# Patient Record
Sex: Male | Born: 2000 | Race: Black or African American | Hispanic: No | Marital: Single | Smoking: Never smoker
Health system: Southern US, Community
[De-identification: ages and names within clinical notes are randomized; demographics above are authoritative.]

## PROBLEM LIST (undated history)

## (undated) DIAGNOSIS — F909 Attention-deficit hyperactivity disorder, unspecified type: Secondary | ICD-10-CM

## (undated) DIAGNOSIS — R569 Unspecified convulsions: Secondary | ICD-10-CM

## (undated) HISTORY — PX: FINGER SURGERY: SHX640

---
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
## 2020-08-23 ENCOUNTER — Inpatient Hospital Stay (HOSPITAL_COMMUNITY): Payer: Medicaid Other | Admitting: Anesthesiology

## 2020-08-23 ENCOUNTER — Inpatient Hospital Stay (HOSPITAL_COMMUNITY): Payer: Medicaid Other

## 2020-08-23 ENCOUNTER — Inpatient Hospital Stay (HOSPITAL_COMMUNITY)
Admission: EM | Admit: 2020-08-23 | Discharge: 2020-09-05 | DRG: 957 | Disposition: A | Discharge status: Rehabilitation Facility | Payer: Medicaid Other | Attending: Physician Assistant | Admitting: Physician Assistant

## 2020-08-23 ENCOUNTER — Emergency Department (HOSPITAL_COMMUNITY): Payer: Medicaid Other

## 2020-08-23 ENCOUNTER — Encounter (HOSPITAL_COMMUNITY): Payer: Self-pay | Admitting: Surgery

## 2020-08-23 ENCOUNTER — Encounter (HOSPITAL_COMMUNITY): Admission: EM | Disposition: A | Discharge status: Rehabilitation Facility | Payer: Self-pay | Source: Home / Self Care

## 2020-08-23 DIAGNOSIS — T1490XA Injury, unspecified, initial encounter: Secondary | ICD-10-CM

## 2020-08-23 DIAGNOSIS — I314 Cardiac tamponade: Secondary | ICD-10-CM

## 2020-08-23 DIAGNOSIS — S299XXA Unspecified injury of thorax, initial encounter: Secondary | ICD-10-CM

## 2020-08-23 DIAGNOSIS — Z9689 Presence of other specified functional implants: Secondary | ICD-10-CM

## 2020-08-23 DIAGNOSIS — Z01818 Encounter for other preprocedural examination: Secondary | ICD-10-CM

## 2020-08-23 DIAGNOSIS — R569 Unspecified convulsions: Secondary | ICD-10-CM

## 2020-08-23 DIAGNOSIS — S2600XA Unspecified injury of heart with hemopericardium, initial encounter: Secondary | ICD-10-CM | POA: Diagnosis present

## 2020-08-23 DIAGNOSIS — S0990XA Unspecified injury of head, initial encounter: Principal | ICD-10-CM | POA: Diagnosis present

## 2020-08-23 DIAGNOSIS — J9811 Atelectasis: Secondary | ICD-10-CM

## 2020-08-23 DIAGNOSIS — I313 Pericardial effusion (noninflammatory): Secondary | ICD-10-CM

## 2020-08-23 DIAGNOSIS — S26022D Major laceration of heart with hemopericardium, subsequent encounter: Secondary | ICD-10-CM

## 2020-08-23 DIAGNOSIS — J9690 Respiratory failure, unspecified, unspecified whether with hypoxia or hypercapnia: Secondary | ICD-10-CM

## 2020-08-23 DIAGNOSIS — E440 Moderate protein-calorie malnutrition: Secondary | ICD-10-CM | POA: Insufficient documentation

## 2020-08-23 DIAGNOSIS — Z0189 Encounter for other specified special examinations: Secondary | ICD-10-CM

## 2020-08-23 DIAGNOSIS — S02609A Fracture of mandible, unspecified, initial encounter for closed fracture: Secondary | ICD-10-CM | POA: Diagnosis present

## 2020-08-23 DIAGNOSIS — S26022A Major laceration of heart with hemopericardium, initial encounter: Secondary | ICD-10-CM | POA: Diagnosis present

## 2020-08-23 DIAGNOSIS — S0266XB Fracture of symphysis of mandible, initial encounter for open fracture: Secondary | ICD-10-CM | POA: Diagnosis not present

## 2020-08-23 DIAGNOSIS — J9601 Acute respiratory failure with hypoxia: Secondary | ICD-10-CM | POA: Diagnosis present

## 2020-08-23 DIAGNOSIS — E876 Hypokalemia: Secondary | ICD-10-CM | POA: Diagnosis not present

## 2020-08-23 DIAGNOSIS — Y848 Other medical procedures as the cause of abnormal reaction of the patient, or of later complication, without mention of misadventure at the time of the procedure: Secondary | ICD-10-CM | POA: Diagnosis not present

## 2020-08-23 DIAGNOSIS — R561 Post traumatic seizures: Secondary | ICD-10-CM | POA: Diagnosis not present

## 2020-08-23 DIAGNOSIS — R578 Other shock: Secondary | ICD-10-CM | POA: Diagnosis present

## 2020-08-23 DIAGNOSIS — K0889 Other specified disorders of teeth and supporting structures: Secondary | ICD-10-CM | POA: Diagnosis present

## 2020-08-23 DIAGNOSIS — S26020A Mild laceration of heart with hemopericardium, initial encounter: Secondary | ICD-10-CM | POA: Diagnosis present

## 2020-08-23 DIAGNOSIS — S065X9A Traumatic subdural hemorrhage with loss of consciousness of unspecified duration, initial encounter: Secondary | ICD-10-CM | POA: Diagnosis present

## 2020-08-23 DIAGNOSIS — R000 Tachycardia, unspecified: Secondary | ICD-10-CM

## 2020-08-23 DIAGNOSIS — E871 Hypo-osmolality and hyponatremia: Secondary | ICD-10-CM | POA: Diagnosis not present

## 2020-08-23 DIAGNOSIS — Z781 Physical restraint status: Secondary | ICD-10-CM

## 2020-08-23 DIAGNOSIS — S069X9D Unspecified intracranial injury with loss of consciousness of unspecified duration, subsequent encounter: Secondary | ICD-10-CM | POA: Diagnosis not present

## 2020-08-23 DIAGNOSIS — B9689 Other specified bacterial agents as the cause of diseases classified elsewhere: Secondary | ICD-10-CM | POA: Diagnosis not present

## 2020-08-23 DIAGNOSIS — R402432 Glasgow coma scale score 3-8, at arrival to emergency department: Secondary | ICD-10-CM | POA: Diagnosis present

## 2020-08-23 DIAGNOSIS — Z20822 Contact with and (suspected) exposure to covid-19: Secondary | ICD-10-CM | POA: Diagnosis present

## 2020-08-23 DIAGNOSIS — S061X9A Traumatic cerebral edema with loss of consciousness of unspecified duration, initial encounter: Secondary | ICD-10-CM | POA: Diagnosis not present

## 2020-08-23 DIAGNOSIS — Z681 Body mass index (BMI) 19 or less, adult: Secondary | ICD-10-CM

## 2020-08-23 DIAGNOSIS — R451 Restlessness and agitation: Secondary | ICD-10-CM | POA: Diagnosis present

## 2020-08-23 DIAGNOSIS — S025XXA Fracture of tooth (traumatic), initial encounter for closed fracture: Secondary | ICD-10-CM | POA: Diagnosis present

## 2020-08-23 DIAGNOSIS — H518 Other specified disorders of binocular movement: Secondary | ICD-10-CM | POA: Diagnosis present

## 2020-08-23 DIAGNOSIS — S0219XA Other fracture of base of skull, initial encounter for closed fracture: Secondary | ICD-10-CM | POA: Diagnosis present

## 2020-08-23 DIAGNOSIS — Y92410 Unspecified street and highway as the place of occurrence of the external cause: Secondary | ICD-10-CM

## 2020-08-23 DIAGNOSIS — I10 Essential (primary) hypertension: Secondary | ICD-10-CM | POA: Diagnosis present

## 2020-08-23 DIAGNOSIS — S8012XA Contusion of left lower leg, initial encounter: Secondary | ICD-10-CM | POA: Diagnosis present

## 2020-08-23 DIAGNOSIS — R4182 Altered mental status, unspecified: Secondary | ICD-10-CM | POA: Diagnosis present

## 2020-08-23 DIAGNOSIS — J95851 Ventilator associated pneumonia: Secondary | ICD-10-CM | POA: Diagnosis not present

## 2020-08-23 DIAGNOSIS — S032XXA Dislocation of tooth, initial encounter: Secondary | ICD-10-CM | POA: Diagnosis present

## 2020-08-23 DIAGNOSIS — I959 Hypotension, unspecified: Secondary | ICD-10-CM

## 2020-08-23 DIAGNOSIS — S2699XS Other injury of heart, unspecified with or without hemopericardium, sequela: Secondary | ICD-10-CM

## 2020-08-23 HISTORY — PX: STERNOTOMY: SHX1057

## 2020-08-23 HISTORY — DX: Unspecified injury of head, initial encounter: S09.90XA

## 2020-08-23 HISTORY — PX: REPAIR OF RIGHT ATRIAL LACERATION: SHX6552

## 2020-08-23 HISTORY — DX: Major laceration of heart with hemopericardium, initial encounter: S26.022A

## 2020-08-23 LAB — POCT I-STAT 7, (LYTES, BLD GAS, ICA,H+H)
Acid-base deficit: 15 mmol/L — ABNORMAL HIGH (ref 0.0–2.0)
Acid-base deficit: 9 mmol/L — ABNORMAL HIGH (ref 0.0–2.0)
Acid-base deficit: 3 mmol/L — ABNORMAL HIGH (ref 0.0–2.0)
Bicarbonate: 14 mmol/L — ABNORMAL LOW (ref 20.0–28.0)
Bicarbonate: 18.3 mmol/L — ABNORMAL LOW (ref 20.0–28.0)
Bicarbonate: 20.5 mmol/L (ref 20.0–28.0)
Calcium, Ion: 1.06 mmol/L — ABNORMAL LOW (ref 1.15–1.40)
Calcium, Ion: 1.01 mmol/L — ABNORMAL LOW (ref 1.15–1.40)
Calcium, Ion: 1.11 mmol/L — ABNORMAL LOW (ref 1.15–1.40)
HCT: 40 % (ref 39.0–52.0)
HCT: 40 % (ref 39.0–52.0)
HCT: 48 % (ref 39.0–52.0)
Hemoglobin: 13.6 g/dL (ref 13.0–17.0)
Hemoglobin: 13.6 g/dL (ref 13.0–17.0)
Hemoglobin: 16.3 g/dL (ref 13.0–17.0)
O2 Saturation: 100 %
O2 Saturation: 100 %
O2 Saturation: 100 %
Patient temperature: 35.5
Potassium: 3.5 mmol/L (ref 3.5–5.1)
Potassium: 4.1 mmol/L (ref 3.5–5.1)
Potassium: 4.9 mmol/L (ref 3.5–5.1)
Sodium: 143 mmol/L (ref 135–145)
Sodium: 144 mmol/L (ref 135–145)
Sodium: 140 mmol/L (ref 135–145)
TCO2: 15 mmol/L — ABNORMAL LOW (ref 22–32)
TCO2: 20 mmol/L — ABNORMAL LOW (ref 22–32)
TCO2: 21 mmol/L — ABNORMAL LOW (ref 22–32)
pCO2 arterial: 45 mmHg (ref 32.0–48.0)
pCO2 arterial: 42.7 mmHg (ref 32.0–48.0)
pCO2 arterial: 31.4 mmHg — ABNORMAL LOW (ref 32.0–48.0)
pH, Arterial: 7.102 — CL (ref 7.350–7.450)
pH, Arterial: 7.24 — ABNORMAL LOW (ref 7.350–7.450)
pH, Arterial: 7.416 (ref 7.350–7.450)
pO2, Arterial: 588 mmHg — ABNORMAL HIGH (ref 83.0–108.0)
pO2, Arterial: 304 mmHg — ABNORMAL HIGH (ref 83.0–108.0)
pO2, Arterial: 269 mmHg — ABNORMAL HIGH (ref 83.0–108.0)

## 2020-08-23 LAB — PREPARE FRESH FROZEN PLASMA
Unit division: 0
Unit division: 0

## 2020-08-23 LAB — COMPREHENSIVE METABOLIC PANEL
ALT: 85 U/L — ABNORMAL HIGH (ref 0–44)
AST: 119 U/L — ABNORMAL HIGH (ref 15–41)
Albumin: 3.5 g/dL (ref 3.5–5.0)
Alkaline Phosphatase: 49 U/L (ref 38–126)
Anion gap: 17 — ABNORMAL HIGH (ref 5–15)
BUN: 9 mg/dL (ref 6–20)
CO2: 16 mmol/L — ABNORMAL LOW (ref 22–32)
Calcium: 8.6 mg/dL — ABNORMAL LOW (ref 8.9–10.3)
Chloride: 108 mmol/L (ref 98–111)
Creatinine, Ser: 1.52 mg/dL — ABNORMAL HIGH (ref 0.61–1.24)
GFR, Estimated: >60 mL/min (ref >60)
Glucose, Bld: 115 mg/dL — ABNORMAL HIGH (ref 70–99)
Potassium: 3 mmol/L — ABNORMAL LOW (ref 3.5–5.1)
Sodium: 141 mmol/L (ref 135–145)
Total Bilirubin: 0.7 mg/dL (ref 0.3–1.2)
Total Protein: 5.6 g/dL — ABNORMAL LOW (ref 6.5–8.1)

## 2020-08-23 LAB — BASIC METABOLIC PANEL
Anion gap: 13 (ref 5–15)
BUN: 8 mg/dL (ref 6–20)
CO2: 20 mmol/L — ABNORMAL LOW (ref 22–32)
Calcium: 7.9 mg/dL — ABNORMAL LOW (ref 8.9–10.3)
Chloride: 108 mmol/L (ref 98–111)
Creatinine, Ser: 1.29 mg/dL — ABNORMAL HIGH (ref 0.61–1.24)
GFR, Estimated: >60 mL/min (ref >60)
Glucose, Bld: 110 mg/dL — ABNORMAL HIGH (ref 70–99)
Potassium: 5.1 mmol/L (ref 3.5–5.1)
Sodium: 141 mmol/L (ref 135–145)

## 2020-08-23 LAB — URINALYSIS, ROUTINE W REFLEX MICROSCOPIC
Bilirubin Urine: NEGATIVE
Glucose, UA: NEGATIVE mg/dL
Ketones, ur: NEGATIVE mg/dL
Leukocytes,Ua: NEGATIVE
Nitrite: NEGATIVE
Protein, ur: NEGATIVE mg/dL
Specific Gravity, Urine: 1.008 (ref 1.005–1.030)
pH: 7 (ref 5.0–8.0)

## 2020-08-23 LAB — POCT I-STAT, CHEM 8
BUN: 8 mg/dL (ref 6–20)
Calcium, Ion: 1.06 mmol/L — ABNORMAL LOW (ref 1.15–1.40)
Chloride: 109 mmol/L (ref 98–111)
Creatinine, Ser: 1.2 mg/dL (ref 0.61–1.24)
Glucose, Bld: 130 mg/dL — ABNORMAL HIGH (ref 70–99)
HCT: 41 % (ref 39.0–52.0)
Hemoglobin: 13.9 g/dL (ref 13.0–17.0)
Potassium: 3.5 mmol/L (ref 3.5–5.1)
Sodium: 143 mmol/L (ref 135–145)
TCO2: 16 mmol/L — ABNORMAL LOW (ref 22–32)

## 2020-08-23 LAB — I-STAT ARTERIAL BLOOD GAS, ED
Acid-base deficit: 17 mmol/L — ABNORMAL HIGH (ref 0.0–2.0)
Bicarbonate: 12.6 mmol/L — ABNORMAL LOW (ref 20.0–28.0)
Calcium, Ion: 1.1 mmol/L — ABNORMAL LOW (ref 1.15–1.40)
HCT: 42 % (ref 39.0–52.0)
Hemoglobin: 14.3 g/dL (ref 13.0–17.0)
O2 Saturation: 100 %
Patient temperature: 97.8
Potassium: 3.8 mmol/L (ref 3.5–5.1)
Sodium: 143 mmol/L (ref 135–145)
TCO2: 14 mmol/L — ABNORMAL LOW (ref 22–32)
pCO2 arterial: 40.8 mmHg (ref 32.0–48.0)
pH, Arterial: 7.096 — CL (ref 7.350–7.450)
pO2, Arterial: 374 mmHg — ABNORMAL HIGH (ref 83.0–108.0)

## 2020-08-23 LAB — CBC
HCT: 47 % (ref 39.0–52.0)
HCT: 50.3 % (ref 39.0–52.0)
Hemoglobin: 14.4 g/dL (ref 13.0–17.0)
Hemoglobin: 16.7 g/dL (ref 13.0–17.0)
MCH: 28.5 pg (ref 26.0–34.0)
MCH: 28.2 pg (ref 26.0–34.0)
MCHC: 30.6 g/dL (ref 30.0–36.0)
MCHC: 33.2 g/dL (ref 30.0–36.0)
MCV: 93.1 fL (ref 80.0–100.0)
MCV: 85 fL (ref 80.0–100.0)
Platelets: 206 10*3/uL (ref 150–400)
Platelets: 172 10*3/uL (ref 150–400)
RBC: 5.05 MIL/uL (ref 4.22–5.81)
RBC: 5.92 MIL/uL — ABNORMAL HIGH (ref 4.22–5.81)
RDW: 13.1 % (ref 11.5–15.5)
RDW: 13.2 % (ref 11.5–15.5)
WBC: 9.9 10*3/uL (ref 4.0–10.5)
WBC: 14.9 10*3/uL — ABNORMAL HIGH (ref 4.0–10.5)
nRBC: 0.2 % (ref 0.0–0.2)
nRBC: 0 % (ref 0.0–0.2)

## 2020-08-23 LAB — PREPARE RBC (CROSSMATCH)

## 2020-08-23 LAB — APTT: aPTT: 29 seconds (ref 24–36)

## 2020-08-23 LAB — PROTIME-INR
INR: 1.4 — ABNORMAL HIGH (ref 0.8–1.2)
INR: 1.3 — ABNORMAL HIGH (ref 0.8–1.2)
Prothrombin Time: 16.4 seconds — ABNORMAL HIGH (ref 11.4–15.2)
Prothrombin Time: 15.7 seconds — ABNORMAL HIGH (ref 11.4–15.2)

## 2020-08-23 LAB — BPAM FFP
Blood Product Expiration Date: 202204102359
Blood Product Expiration Date: 202204112359
ISSUE DATE / TIME: 202204090107
ISSUE DATE / TIME: 202204090107
Unit Type and Rh: 6200
Unit Type and Rh: 6200

## 2020-08-23 LAB — I-STAT CHEM 8, ED
BUN: 9 mg/dL (ref 6–20)
Calcium, Ion: 0.88 mmol/L — CL (ref 1.15–1.40)
Chloride: 109 mmol/L (ref 98–111)
Creatinine, Ser: 1.5 mg/dL — ABNORMAL HIGH (ref 0.61–1.24)
Glucose, Bld: 139 mg/dL — ABNORMAL HIGH (ref 70–99)
HCT: 44 % (ref 39.0–52.0)
Hemoglobin: 15 g/dL (ref 13.0–17.0)
Potassium: 3.9 mmol/L (ref 3.5–5.1)
Sodium: 139 mmol/L (ref 135–145)
TCO2: 11 mmol/L — ABNORMAL LOW (ref 22–32)

## 2020-08-23 LAB — MAGNESIUM: Magnesium: 1.6 mg/dL — ABNORMAL LOW (ref 1.7–2.4)

## 2020-08-23 LAB — RESP PANEL BY RT-PCR (FLU A&B, COVID) ARPGX2
Influenza A by PCR: NEGATIVE
Influenza B by PCR: NEGATIVE
SARS Coronavirus 2 by RT PCR: NEGATIVE

## 2020-08-23 LAB — GLUCOSE, CAPILLARY
Glucose-Capillary: 113 mg/dL — ABNORMAL HIGH (ref 70–99)
Glucose-Capillary: 107 mg/dL — ABNORMAL HIGH (ref 70–99)
Glucose-Capillary: 119 mg/dL — ABNORMAL HIGH (ref 70–99)
Glucose-Capillary: 111 mg/dL — ABNORMAL HIGH (ref 70–99)
Glucose-Capillary: 85 mg/dL (ref 70–99)

## 2020-08-23 LAB — ECHO INTRAOPERATIVE TEE
Height: 74 in
Weight: 2469.15 oz

## 2020-08-23 LAB — LACTIC ACID, PLASMA: Lactic Acid, Venous: 10.6 mmol/L (ref 0.5–1.9)

## 2020-08-23 LAB — BLOOD PRODUCT ORDER (VERBAL) VERIFICATION

## 2020-08-23 LAB — MRSA PCR SCREENING: MRSA by PCR: NEGATIVE

## 2020-08-23 LAB — ETHANOL: Alcohol, Ethyl (B): 32 mg/dL — ABNORMAL HIGH (ref <10)

## 2020-08-23 LAB — ABO/RH: ABO/RH(D): O POS

## 2020-08-23 LAB — HIV ANTIBODY (ROUTINE TESTING W REFLEX): HIV Screen 4th Generation wRfx: NONREACTIVE

## 2020-08-23 SURGERY — STERNOTOMY
Anesthesia: General | Site: Chest

## 2020-08-23 MED ORDER — ONDANSETRON HCL 4 MG/2ML IJ SOLN
4.0000 mg | Freq: Four times a day (QID) | INTRAMUSCULAR | Status: DC | PRN
  Administered 2020-09-03 – 2020-09-04 (×2): 4 mg via INTRAVENOUS
  Filled 2020-08-23 (×2): qty 2

## 2020-08-23 MED ORDER — LACTATED RINGERS IV SOLN: INTRAVENOUS | Status: DC

## 2020-08-23 MED ORDER — SODIUM CHLORIDE 0.9 % IV SOLN
INTRAVENOUS | Status: AC | PRN
  Administered 2020-08-23 (×2): 1000 mL via INTRAVENOUS

## 2020-08-23 MED ORDER — ACETAMINOPHEN 500 MG PO TABS
1000.0000 mg | ORAL_TABLET | Freq: Four times a day (QID) | ORAL | Status: AC
  Filled 2020-08-23: qty 2

## 2020-08-23 MED ORDER — CEFAZOLIN SODIUM-DEXTROSE 2-3 GM-%(50ML) IV SOLR
INTRAVENOUS | Status: DC | PRN
  Administered 2020-08-23: 2 g via INTRAVENOUS

## 2020-08-23 MED ORDER — ORAL CARE MOUTH RINSE: 15.0000 mL | OROMUCOSAL | Status: DC

## 2020-08-23 MED ORDER — IOHEXOL 300 MG/ML  SOLN
100.0000 mL | Freq: Once | INTRAMUSCULAR | Status: AC | PRN
  Administered 2020-08-23: 100 mL via INTRAVENOUS

## 2020-08-23 MED ORDER — DOCUSATE SODIUM 100 MG PO CAPS: 200.0000 mg | ORAL_CAPSULE | Freq: Every day | ORAL | Status: DC

## 2020-08-23 MED ORDER — PANTOPRAZOLE SODIUM 40 MG PO TBEC: 40.0000 mg | DELAYED_RELEASE_TABLET | Freq: Every day | ORAL | Status: DC

## 2020-08-23 MED ORDER — FENTANYL 2500MCG IN NS 250ML (10MCG/ML) PREMIX INFUSION
0.0000 ug/h | INTRAVENOUS | Status: DC
  Administered 2020-08-23 – 2020-08-24 (×2): 100 ug/h via INTRAVENOUS
  Administered 2020-08-25: 75 ug/h via INTRAVENOUS
  Administered 2020-08-26: 10 ug/h via INTRAVENOUS
  Administered 2020-08-27: 150 ug/h via INTRAVENOUS
  Administered 2020-08-28: 75 ug/h via INTRAVENOUS
  Filled 2020-08-23 (×7): qty 250

## 2020-08-23 MED ORDER — FAMOTIDINE IN NACL 20-0.9 MG/50ML-% IV SOLN: 20.0000 mg | Freq: Two times a day (BID) | INTRAVENOUS | Status: DC

## 2020-08-23 MED ORDER — SODIUM BICARBONATE 8.4 % IV SOLN
INTRAVENOUS | Status: DC | PRN
  Administered 2020-08-23: 50 meq via INTRAVENOUS

## 2020-08-23 MED ORDER — IOHEXOL 300 MG/ML  SOLN: 100.0000 mL | Freq: Once | INTRAMUSCULAR | Status: DC | PRN

## 2020-08-23 MED ORDER — PROPOFOL 1000 MG/100ML IV EMUL
INTRAVENOUS | Status: AC | PRN
  Administered 2020-08-23: 5 ug/kg/min via INTRAVENOUS

## 2020-08-23 MED ORDER — CHLORHEXIDINE GLUCONATE 0.12% ORAL RINSE (MEDLINE KIT): 15.0000 mL | Freq: Two times a day (BID) | OROMUCOSAL | Status: DC

## 2020-08-23 MED ORDER — FENTANYL CITRATE (PF) 100 MCG/2ML IJ SOLN
50.0000 ug | INTRAMUSCULAR | Status: DC | PRN
Start: 2020-08-23 — End: 2020-08-23
  Administered 2020-08-23 (×4): 100 ug via INTRAVENOUS
  Filled 2020-08-23 (×4): qty 2

## 2020-08-23 MED ORDER — CHLORHEXIDINE GLUCONATE 0.12% ORAL RINSE (MEDLINE KIT)
15.0000 mL | Freq: Two times a day (BID) | OROMUCOSAL | Status: DC
  Administered 2020-08-23 – 2020-09-05 (×24): 15 mL via OROMUCOSAL

## 2020-08-23 MED ORDER — ACETAMINOPHEN 160 MG/5ML PO SOLN: 650.0000 mg | Freq: Once | ORAL | Status: DC

## 2020-08-23 MED ORDER — SODIUM CHLORIDE 0.45 % IV SOLN: INTRAVENOUS | Status: DC | PRN

## 2020-08-23 MED ORDER — LORAZEPAM 2 MG/ML IJ SOLN
INTRAMUSCULAR | Status: AC | PRN
  Administered 2020-08-23: 2 mg via INTRAVENOUS

## 2020-08-23 MED ORDER — ALBUMIN HUMAN 5 % IV SOLN: 250.0000 mL | INTRAVENOUS | Status: DC | PRN

## 2020-08-23 MED ORDER — DEXTROSE 50 % IV SOLN: 0.0000 mL | INTRAVENOUS | Status: DC | PRN

## 2020-08-23 MED ORDER — SODIUM CHLORIDE 0.9 % IV SOLN
INTRAVENOUS | Status: AC
  Filled 2020-08-23: qty 1.2

## 2020-08-23 MED ORDER — ACETAMINOPHEN 650 MG RE SUPP: 650.0000 mg | Freq: Once | RECTAL | Status: DC

## 2020-08-23 MED ORDER — ACETAMINOPHEN 160 MG/5ML PO SOLN
1000.0000 mg | Freq: Four times a day (QID) | ORAL | Status: AC
  Administered 2020-08-23 – 2020-08-27 (×19): 1000 mg
  Filled 2020-08-23 (×19): qty 40.6

## 2020-08-23 MED ORDER — OXYCODONE HCL 5 MG PO TABS: 5.0000 mg | ORAL_TABLET | ORAL | Status: DC | PRN

## 2020-08-23 MED ORDER — ONDANSETRON 4 MG PO TBDP
4.0000 mg | ORAL_TABLET | Freq: Four times a day (QID) | ORAL | Status: DC | PRN
  Filled 2020-08-23: qty 1

## 2020-08-23 MED ORDER — PROPOFOL 1000 MG/100ML IV EMUL
5.0000 ug/kg/min | INTRAVENOUS | Status: DC
  Administered 2020-08-23: 15 ug/kg/min via INTRAVENOUS
  Administered 2020-08-23: 65 ug/kg/min via INTRAVENOUS
  Administered 2020-08-23: 35 ug/kg/min via INTRAVENOUS
  Administered 2020-08-23: 50 ug/kg/min via INTRAVENOUS
  Administered 2020-08-24: 30 ug/kg/min via INTRAVENOUS
  Administered 2020-08-24 – 2020-08-25 (×3): 40 ug/kg/min via INTRAVENOUS
  Administered 2020-08-25: 60 ug/kg/min via INTRAVENOUS
  Administered 2020-08-25 (×2): 40 ug/kg/min via INTRAVENOUS
  Administered 2020-08-25: 50 ug/kg/min via INTRAVENOUS
  Administered 2020-08-26: 60 ug/kg/min via INTRAVENOUS
  Administered 2020-08-26: 50 ug/kg/min via INTRAVENOUS
  Administered 2020-08-26: 60 ug/kg/min via INTRAVENOUS
  Filled 2020-08-23: qty 200
  Filled 2020-08-23 (×15): qty 100

## 2020-08-23 MED ORDER — SODIUM CHLORIDE 0.9 % IV SOLN: INTRAVENOUS | Status: DC | PRN

## 2020-08-23 MED ORDER — SUCCINYLCHOLINE CHLORIDE 20 MG/ML IJ SOLN
INTRAMUSCULAR | Status: AC | PRN
  Administered 2020-08-23: 100 mg via INTRAVENOUS

## 2020-08-23 MED ORDER — INSULIN ASPART 100 UNIT/ML ~~LOC~~ SOLN
0.0000 [IU] | SUBCUTANEOUS | Status: DC
  Administered 2020-08-24 – 2020-08-28 (×10): 1 [IU] via SUBCUTANEOUS
  Administered 2020-08-28: 2 [IU] via SUBCUTANEOUS
  Administered 2020-08-28 – 2020-09-02 (×20): 1 [IU] via SUBCUTANEOUS
  Administered 2020-09-03: 2 [IU] via SUBCUTANEOUS
  Administered 2020-09-03 (×2): 1 [IU] via SUBCUTANEOUS
  Administered 2020-09-03: 2 [IU] via SUBCUTANEOUS
  Administered 2020-09-03 – 2020-09-05 (×5): 1 [IU] via SUBCUTANEOUS

## 2020-08-23 MED ORDER — ALBUMIN HUMAN 5 % IV SOLN: INTRAVENOUS | Status: DC | PRN

## 2020-08-23 MED ORDER — ETOMIDATE 2 MG/ML IV SOLN
INTRAVENOUS | Status: AC | PRN
  Administered 2020-08-23: 20 mg via INTRAVENOUS

## 2020-08-23 MED ORDER — MAGNESIUM SULFATE 4 GM/100ML IV SOLN
4.0000 g | Freq: Once | INTRAVENOUS | Status: DC
  Filled 2020-08-23: qty 100

## 2020-08-23 MED ORDER — 0.9 % SODIUM CHLORIDE (POUR BTL) OPTIME
TOPICAL | Status: DC | PRN
  Administered 2020-08-23: 5000 mL

## 2020-08-23 MED ORDER — POTASSIUM CHLORIDE 10 MEQ/50ML IV SOLN: 10.0000 meq | INTRAVENOUS | Status: AC

## 2020-08-23 MED ORDER — LACTATED RINGERS IV SOLN: 500.0000 mL | Freq: Once | INTRAVENOUS | Status: DC | PRN

## 2020-08-23 MED ORDER — DEXMEDETOMIDINE HCL IN NACL 400 MCG/100ML IV SOLN: 0.0000 ug/kg/h | INTRAVENOUS | Status: DC

## 2020-08-23 MED ORDER — BISACODYL 5 MG PO TBEC: 10.0000 mg | DELAYED_RELEASE_TABLET | Freq: Every day | ORAL | Status: DC

## 2020-08-23 MED ORDER — TRAMADOL HCL 50 MG PO TABS: 50.0000 mg | ORAL_TABLET | ORAL | Status: DC | PRN

## 2020-08-23 MED ORDER — FENTANYL CITRATE (PF) 250 MCG/5ML IJ SOLN
INTRAMUSCULAR | Status: AC
  Filled 2020-08-23: qty 5

## 2020-08-23 MED ORDER — ONDANSETRON HCL 4 MG/2ML IJ SOLN: 4.0000 mg | Freq: Four times a day (QID) | INTRAMUSCULAR | Status: DC | PRN

## 2020-08-23 MED ORDER — IOHEXOL 350 MG/ML SOLN
75.0000 mL | Freq: Once | INTRAVENOUS | Status: AC | PRN
  Administered 2020-08-23: 75 mL via INTRAVENOUS

## 2020-08-23 MED ORDER — BISACODYL 10 MG RE SUPP: 10.0000 mg | Freq: Every day | RECTAL | Status: DC

## 2020-08-23 MED ORDER — MORPHINE SULFATE (PF) 2 MG/ML IV SOLN: 1.0000 mg | INTRAVENOUS | Status: DC | PRN

## 2020-08-23 MED ORDER — FENTANYL CITRATE (PF) 250 MCG/5ML IJ SOLN
INTRAMUSCULAR | Status: DC | PRN
  Administered 2020-08-23 (×2): 100 ug via INTRAVENOUS
  Administered 2020-08-23: 50 ug via INTRAVENOUS

## 2020-08-23 MED ORDER — PROPOFOL 500 MG/50ML IV EMUL
INTRAVENOUS | Status: DC | PRN
  Administered 2020-08-23: 50 ug/kg/min via INTRAVENOUS

## 2020-08-23 MED ORDER — CHLORHEXIDINE GLUCONATE CLOTH 2 % EX PADS
6.0000 | MEDICATED_PAD | Freq: Every day | CUTANEOUS | Status: DC
  Administered 2020-08-23 – 2020-09-05 (×12): 6 via TOPICAL

## 2020-08-23 MED ORDER — FENTANYL CITRATE (PF) 100 MCG/2ML IJ SOLN
INTRAMUSCULAR | Status: AC | PRN
  Administered 2020-08-23: 100 ug via INTRAVENOUS

## 2020-08-23 MED ORDER — SODIUM CHLORIDE 0.9 % IV SOLN: 250.0000 mL | INTRAVENOUS | Status: DC

## 2020-08-23 MED ORDER — ORAL CARE MOUTH RINSE
15.0000 mL | OROMUCOSAL | Status: DC
  Administered 2020-08-24 – 2020-08-29 (×46): 15 mL via OROMUCOSAL

## 2020-08-23 MED ORDER — MUPIROCIN 2 % EX OINT
1.0000 "application " | TOPICAL_OINTMENT | Freq: Two times a day (BID) | CUTANEOUS | Status: DC
  Administered 2020-08-23: 1 via NASAL
  Filled 2020-08-23: qty 22

## 2020-08-23 MED ORDER — ROCURONIUM 10MG/ML (10ML) SYRINGE FOR MEDFUSION PUMP - OPTIME
INTRAVENOUS | Status: DC | PRN
  Administered 2020-08-23: 70 mg via INTRAVENOUS

## 2020-08-23 MED ORDER — MIDAZOLAM HCL 2 MG/2ML IJ SOLN: 2.0000 mg | INTRAMUSCULAR | Status: DC | PRN

## 2020-08-23 MED ORDER — DEXTROSE-NACL 5-0.45 % IV SOLN
INTRAVENOUS | Status: DC
  Administered 2020-08-24: 100 mL/h via INTRAVENOUS

## 2020-08-23 MED ORDER — PROPOFOL 1000 MG/100ML IV EMUL
INTRAVENOUS | Status: AC
  Filled 2020-08-23: qty 100

## 2020-08-23 MED ORDER — PLASMA-LYTE 148 IV SOLN
INTRAVENOUS | Status: DC | PRN
  Administered 2020-08-23: 500 mL via INTRAVASCULAR

## 2020-08-23 MED ORDER — SODIUM CHLORIDE 0.9 % IV SOLN: INTRAVENOUS | Status: DC

## 2020-08-23 MED ORDER — LACTATED RINGERS IV SOLN: INTRAVENOUS | Status: DC | PRN

## 2020-08-23 MED ORDER — LORAZEPAM 2 MG/ML IJ SOLN
1.0000 mg | Freq: Once | INTRAMUSCULAR | Status: AC
  Administered 2020-08-23: 1 mg via INTRAVENOUS

## 2020-08-23 MED ORDER — SODIUM CHLORIDE 0.9% FLUSH: 3.0000 mL | INTRAVENOUS | Status: DC | PRN

## 2020-08-23 MED ORDER — CHLORHEXIDINE GLUCONATE 0.12 % MT SOLN: 15.0000 mL | OROMUCOSAL | Status: AC

## 2020-08-23 MED ORDER — METOPROLOL TARTRATE 5 MG/5ML IV SOLN
2.5000 mg | INTRAVENOUS | Status: DC | PRN
Start: 2020-08-23 — End: 2020-09-05
  Administered 2020-08-23 (×2): 2 mg via INTRAVENOUS
  Administered 2020-08-23: 5 mg via INTRAVENOUS
  Administered 2020-08-24 (×2): 2.5 mg via INTRAVENOUS
  Administered 2020-08-25 – 2020-08-26 (×2): 5 mg via INTRAVENOUS
  Administered 2020-08-26: 2.5 mg via INTRAVENOUS
  Filled 2020-08-23 (×6): qty 5

## 2020-08-23 MED ORDER — CEFAZOLIN SODIUM-DEXTROSE 1-4 GM/50ML-% IV SOLN
1.0000 g | Freq: Three times a day (TID) | INTRAVENOUS | Status: AC
  Administered 2020-08-23 (×2): 1 g via INTRAVENOUS
  Filled 2020-08-23 (×2): qty 50

## 2020-08-23 MED ORDER — LORAZEPAM 2 MG/ML IJ SOLN
1.0000 mg | Freq: Once | INTRAMUSCULAR | Status: AC
  Administered 2020-08-23: 1 mg via INTRAVENOUS
  Filled 2020-08-23: qty 1

## 2020-08-23 MED ORDER — LEVETIRACETAM IN NACL 1000 MG/100ML IV SOLN
1000.0000 mg | Freq: Once | INTRAVENOUS | Status: AC
  Administered 2020-08-23: 1000 mg via INTRAVENOUS
  Filled 2020-08-23: qty 100

## 2020-08-23 MED ORDER — LEVETIRACETAM IN NACL 500 MG/100ML IV SOLN
500.0000 mg | Freq: Two times a day (BID) | INTRAVENOUS | Status: DC
  Administered 2020-08-23 – 2020-08-24 (×2): 500 mg via INTRAVENOUS
  Filled 2020-08-23 (×2): qty 100

## 2020-08-23 MED ORDER — SODIUM CHLORIDE (PF) 0.9 % IJ SOLN
OROMUCOSAL | Status: DC | PRN
  Administered 2020-08-23 (×3): 4 mL via TOPICAL

## 2020-08-23 MED ORDER — MAGNESIUM SULFATE 4 GM/100ML IV SOLN
4.0000 g | Freq: Once | INTRAVENOUS | Status: AC
  Administered 2020-08-23: 4 g via INTRAVENOUS

## 2020-08-23 MED ORDER — LORAZEPAM 2 MG/ML IJ SOLN
INTRAMUSCULAR | Status: AC
  Filled 2020-08-23: qty 1

## 2020-08-23 MED ORDER — SODIUM CHLORIDE 0.9% FLUSH
3.0000 mL | Freq: Two times a day (BID) | INTRAVENOUS | Status: DC
  Administered 2020-08-24 – 2020-08-27 (×6): 3 mL via INTRAVENOUS

## 2020-08-23 MED ORDER — INSULIN REGULAR(HUMAN) IN NACL 100-0.9 UT/100ML-% IV SOLN: INTRAVENOUS | Status: DC

## 2020-08-23 SURGICAL SUPPLY — 50 items
BLADE CLIPPER SURG (BLADE) IMPLANT
CANISTER SUCT 3000ML PPV (MISCELLANEOUS) ×3 IMPLANT
CATH THORACIC 28FR (CATHETERS) IMPLANT
CATH THORACIC 28FR RT ANG (CATHETERS) IMPLANT
CATH THORACIC 36FR (CATHETERS) IMPLANT
CATH THORACIC 36FR RT ANG (CATHETERS) IMPLANT
CNTNR URN SCR LID CUP LEK RST (MISCELLANEOUS) ×2 IMPLANT
CONN ST 1/4X3/8  BEN (MISCELLANEOUS) ×2
CONN ST 1/4X3/8 BEN (MISCELLANEOUS) ×4 IMPLANT
CONT SPEC 4OZ STRL OR WHT (MISCELLANEOUS) ×3
COVER SURGICAL LIGHT HANDLE (MISCELLANEOUS) ×6 IMPLANT
DRAIN CHANNEL 32F RND 10.7 FF (WOUND CARE) ×6 IMPLANT
DRAPE CARDIOVASCULAR INCISE (DRAPES) ×3
DRAPE SRG 135X102X78XABS (DRAPES) ×2 IMPLANT
DRSG AQUACEL AG ADV 3.5X14 (GAUZE/BANDAGES/DRESSINGS) ×3 IMPLANT
ELECT BLADE 4.0 EZ CLEAN MEGAD (MISCELLANEOUS) ×3
ELECT REM PT RETURN 9FT ADLT (ELECTROSURGICAL) ×3
ELECTRODE BLDE 4.0 EZ CLN MEGD (MISCELLANEOUS) ×2 IMPLANT
ELECTRODE REM PT RTRN 9FT ADLT (ELECTROSURGICAL) ×2 IMPLANT
FIBERTAPE STERNAL CLSR 2 36IN (SUTURE) ×12 IMPLANT
FIBERTAPE STERNAL CLSR 2X36 (SUTURE) ×12 IMPLANT
GAUZE SPONGE 4X4 12PLY STRL (GAUZE/BANDAGES/DRESSINGS) ×3 IMPLANT
GLOVE ORTHO TXT STRL SZ7.5 (GLOVE) ×6 IMPLANT
GOWN STRL REUS W/ TWL XL LVL3 (GOWN DISPOSABLE) ×8 IMPLANT
GOWN STRL REUS W/TWL XL LVL3 (GOWN DISPOSABLE) ×12
HEMOSTAT POWDER SURGIFOAM 1G (HEMOSTASIS) ×9 IMPLANT
KIT BASIN OR (CUSTOM PROCEDURE TRAY) ×3 IMPLANT
KIT TURNOVER KIT B (KITS) ×3 IMPLANT
NDL SUT PASSING CERCLAGE MED (SUTURE) ×6
NEEDLE SUT PASSING CERCLAG MED (SUTURE) ×4 IMPLANT
NS IRRIG 1000ML POUR BTL (IV SOLUTION) ×15 IMPLANT
PACK E OPEN HEART (SUTURE) ×3 IMPLANT
PACK OPEN HEART (CUSTOM PROCEDURE TRAY) ×3 IMPLANT
PAD ARMBOARD 7.5X6 YLW CONV (MISCELLANEOUS) ×6 IMPLANT
PAD ELECT DEFIB RADIOL ZOLL (MISCELLANEOUS) ×3 IMPLANT
STRIP CLOSURE SKIN 1/2X4 (GAUZE/BANDAGES/DRESSINGS) ×3 IMPLANT
SUT MNCRL AB 3-0 PS2 18 (SUTURE) ×3 IMPLANT
SUT PROLENE 3 0 SH DA (SUTURE) ×3 IMPLANT
SUT PROLENE 3 0 SH1 36 (SUTURE) ×3 IMPLANT
SUT VIC AB 1 CTX 27 (SUTURE) ×6 IMPLANT
SUT VIC AB 2-0 CT1 36 (SUTURE) ×3 IMPLANT
SUT VIC AB 3-0 SH 18 (SUTURE) ×6 IMPLANT
SUT VIC AB 3-0 SH 8-18 (SUTURE) ×6 IMPLANT
SWAB COLLECTION DEVICE MRSA (MISCELLANEOUS) IMPLANT
SWAB CULTURE ESWAB REG 1ML (MISCELLANEOUS) IMPLANT
SYR 50ML SLIP (SYRINGE) IMPLANT
SYSTEM SAHARA CHEST DRAIN ATS (WOUND CARE) ×3 IMPLANT
TOWEL GREEN STERILE (TOWEL DISPOSABLE) ×3 IMPLANT
TOWEL GREEN STERILE FF (TOWEL DISPOSABLE) ×3 IMPLANT
WATER STERILE IRR 1000ML POUR (IV SOLUTION) ×3 IMPLANT

## 2020-08-23 NOTE — Progress Notes (Signed)
RN called [facility] just to have a second Nurse look at pupil reactions. RN was told in report that pt has been having episodes of noncreative changes to light. On my shift, Right Pupil seems to be 31mm and react brisk as the left pupil is 80mm and sluggish.

## 2020-08-23 NOTE — Transfer of Care (Signed)
Immediate Anesthesia Transfer of Care Note  Patient: Steve Mejia  Procedure(s) Performed: MEDIAL STERNOTOMY (N/A Chest) REPAIR OF RIGHT ATRIAL LACERATION (N/A Chest)  Patient Location: SICU  Anesthesia Type:General  Level of Consciousness: Patient remains intubated per anesthesia plan  Airway & Oxygen Therapy: Patient placed on Ventilator (see vital sign flow sheet for setting)  Post-op Assessment: Report given to RN and Post -op Vital signs reviewed and stable  Post vital signs: Reviewed and stable  Last Vitals:  Vitals Value Taken Time  BP 151/109 08/23/20 0516  Temp 35.4 C 08/23/20 0523  Pulse 62 08/23/20 0523  Resp 21 08/23/20 0523  SpO2 100 % 08/23/20 0523  Vitals shown include unvalidated device data.  Last Pain:  Vitals:   08/23/20 0120  TempSrc: Temporal         Complications: No complications documented.
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
## 2020-08-23 NOTE — Progress Notes (Signed)
EEG complete - results pending 
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
## 2020-08-23 NOTE — TOC CAGE-AID Note (Signed)
Transition of Care [facility]) - CAGE-AID Screening   Patient Details  Name: Steve Mejia MRN: [id number] Date of Birth: 2001-03-30  Hewitt Shorts, RN Trauma Response Nurse Phone Number: 08/23/2020, 5:14 PM   Clinical Narrative: pt is currently intubated in ICU- unable to participate    CAGE-AID Screening: Substance Abuse Screening unable to be completed due to: : Patient unable to participate (Pt is intubated and sedated)  Have You Ever Felt You Ought to Cut Down on Your Drinking or Drug Use?: No Have People Annoyed You By Critizing Your Drinking Or Drug Use?: No Have You Felt Bad Or Guilty About Your Drinking Or Drug Use?: No Have You Ever Had a Drink or Used Drugs First Thing In The Morning to Steady Your Nerves or to Get Rid of a Hangover?: No CAGE-AID Score: 0
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
## 2020-08-23 NOTE — Progress Notes (Signed)
  Echocardiogram Echocardiogram Transesophageal has been performed.  Delcie Roch 08/23/2020, 8:19 AM
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
## 2020-08-23 NOTE — ED Notes (Signed)
Patient transported to CT 
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
## 2020-08-23 NOTE — Progress Notes (Signed)
[address]       [name] [address]             [phone]        CARDIOTHORACIC SURGERY PROGRESS NOTE   RDay of Surgery Procedure(s) (LRB): MEDIAL STERNOTOMY (N/A) REPAIR OF RIGHT ATRIAL LACERATION (N/A)  Subjective: Sedated on vent.  Reportedly has not woken up yet.  Objective: Vital signs: BP Readings from Last 1 Encounters:  08/23/20 (!) 128/93   Pulse Readings from Last 1 Encounters:  08/23/20 96   Resp Readings from Last 1 Encounters:  08/23/20 18   Temp Readings from Last 1 Encounters:  08/23/20 (!) 100.4 F (38 C)    Hemodynamics:    Physical Exam:  Rhythm:   sinus  Breath sounds: clear  Heart sounds:  RRR  Incisions:  Dressing dry, intact  Abdomen:  Soft, non-distended  Extremities:  Warm, well-perfused  Chest tubes:  Very low volume thin serosanguinous output, no air leak    Intake/Output from previous day: 04/08 0701 - 04/09 0700 In: 6328 [I.V.:4153; Blood:1325; IV Piggyback:250] Out: 0177 [Urine:2475; Emesis/NG output:250; Blood:1200; Chest Tube:40] Intake/Output this shift: Total I/O In: 131 [I.V.:131] Out: 475 [Urine:475]  Lab Results:  CBC: Recent Labs    08/23/20 0153 08/23/20 0200 08/23/20 0500 08/23/20 0529  WBC 9.9  --  14.9*  --   HGB 14.4   < > 16.7 16.3  HCT 47.0   < > 50.3 48.0  PLT 206  --  172  --    < > = values in this interval not displayed.    BMET:  Recent Labs    08/23/20 0211 08/23/20 0241 08/23/20 0327 08/23/20 0426 08/23/20 0500 08/23/20 0529  NA 141   < > 143   < > 141 140  K 3.0*   < > 3.5   < > 5.1 4.9  CL 108  --  109  --  108  --   CO2 16*  --   --   --  20*  --   GLUCOSE 115*  --  130*  --  110*  --   BUN 9  --  8  --  8  --   CREATININE 1.52*  --  1.20  --  1.29*  --   CALCIUM 8.6*  --   --   --  7.9*  --    < > = values in this interval not displayed.     PT/INR:   Recent Labs    08/23/20 0500  LABPROT 15.7*  INR 1.3*    CBG (last 3)  Recent Labs     08/23/20 0526 08/23/20 0748  GLUCAP 113* 107*    ABG    Component Value Date/Time   PHART 7.416 08/23/2020 0529   PCO2ART 31.4 (L) 08/23/2020 0529   PO2ART 269 (H) 08/23/2020 0529   HCO3 20.5 08/23/2020 0529   TCO2 21 (L) 08/23/2020 0529   ACIDBASEDEF 3.0 (H) 08/23/2020 0529   O2SAT 100.0 08/23/2020 0529    CXR: PORTABLE CHEST 1 VIEW  COMPARISON:  Chest x-ray 08/23/2020.  FINDINGS: Patient is intubated, with the tip of the endotracheal tube 5.5 cm above the carina. New tubing projecting over the mediastinum likely reflects mediastinal/pericardial drains. Lung volumes are normal. No consolidative airspace disease. No pleural effusions. No pneumothorax. No pulmonary nodule or mass noted. Pulmonary vasculature and the cardiomediastinal silhouette are within normal limits.  IMPRESSION: 1. Support apparatus, as above. 2.  No radiographic evidence of acute cardiopulmonary disease.   Electronically Signed   By: Trudie Reed M.D.   On: 08/23/2020 06:33   Assessment/Plan: S/P Procedure(s) (LRB): MEDIAL STERNOTOMY (N/A) REPAIR OF RIGHT ATRIAL LACERATION (N/A)  Clinically stable from standpoint of cardiac injury Neuro status unclear - patient does not have any metal implants and could undergo MRI brain if indicated Will defer remainder of care to trauma service   Purcell Nails, MD 08/23/2020 8:54 AM

## 2020-08-23 NOTE — Progress Notes (Addendum)
EEG due to diffuse slowing with overriding fast.  Diffusely might be due to generalized brain dysfunction and excessive fast activity likely medication effect. No seizures or epileptogenicity. MRI brain completed and reviewed There is a subtle area of cortical edema in the left anterior and superior temporal lobe in the inferior frontal lobe which is associated with small areas of hemorrhage-likely contusion.  In addition there is small subdural hematoma anterior to the temporal lobe.  There is also abnormal diffusion in the corpus callosum-concerning for shearing injury/TBI.    Updated recommendations: Continue current course of treatment He will need more surgery tomorrow Watch for any clinical seizures-recall neurology Hook up to LTM if he has more seizures.   -- Milon Dikes, MD Neurologist [facility] Pager: [phone]  Additional 30 min of CC time.
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
## 2020-08-23 NOTE — Progress Notes (Signed)
Critical ABG results given to Sophronia Simas, MD.

## 2020-08-23 NOTE — Anesthesia Procedure Notes (Signed)
Arterial Line Insertion Start/End4/01/2021 3:10 AM, 08/23/2020 3:15 AM Performed by: Atilano Median, DO, anesthesiologist  Patient location: OR. Preanesthetic checklist: patient identified, IV checked, site marked, monitors and equipment checked and pre-op evaluation radial was placed Catheter size: 20 G Hand hygiene performed  and maximum sterile barriers used   Attempts: 1 Procedure performed using ultrasound guided technique. Following insertion, dressing applied and Biopatch. Post procedure assessment: normal
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
## 2020-08-24 ENCOUNTER — Inpatient Hospital Stay (HOSPITAL_COMMUNITY): Payer: Medicaid Other

## 2020-08-24 ENCOUNTER — Encounter (HOSPITAL_COMMUNITY): Admission: EM | Disposition: A | Discharge status: Rehabilitation Facility | Payer: Self-pay | Source: Home / Self Care

## 2020-08-24 ENCOUNTER — Inpatient Hospital Stay (HOSPITAL_COMMUNITY): Payer: Medicaid Other | Admitting: Anesthesiology

## 2020-08-24 DIAGNOSIS — S26022A Major laceration of heart with hemopericardium, initial encounter: Secondary | ICD-10-CM

## 2020-08-24 HISTORY — PX: ORIF MANDIBULAR FRACTURE: SHX2127

## 2020-08-24 LAB — CBC
HCT: 46.5 % (ref 39.0–52.0)
Hemoglobin: 15.4 g/dL (ref 13.0–17.0)
MCH: 28.1 pg (ref 26.0–34.0)
MCHC: 33.1 g/dL (ref 30.0–36.0)
MCV: 84.9 fL (ref 80.0–100.0)
Platelets: 150 10*3/uL (ref 150–400)
RBC: 5.48 MIL/uL (ref 4.22–5.81)
RDW: 13.2 % (ref 11.5–15.5)
WBC: 10.5 10*3/uL (ref 4.0–10.5)
nRBC: 0 % (ref 0.0–0.2)

## 2020-08-24 LAB — BASIC METABOLIC PANEL
Anion gap: 8 (ref 5–15)
BUN: 10 mg/dL (ref 6–20)
CO2: 19 mmol/L — ABNORMAL LOW (ref 22–32)
Calcium: 8.8 mg/dL — ABNORMAL LOW (ref 8.9–10.3)
Chloride: 113 mmol/L — ABNORMAL HIGH (ref 98–111)
Creatinine, Ser: 1.29 mg/dL — ABNORMAL HIGH (ref 0.61–1.24)
GFR, Estimated: >60 mL/min (ref >60)
Glucose, Bld: 155 mg/dL — ABNORMAL HIGH (ref 70–99)
Potassium: 4.4 mmol/L (ref 3.5–5.1)
Sodium: 140 mmol/L (ref 135–145)

## 2020-08-24 LAB — GLUCOSE, CAPILLARY
Glucose-Capillary: 89 mg/dL (ref 70–99)
Glucose-Capillary: 97 mg/dL (ref 70–99)
Glucose-Capillary: 143 mg/dL — ABNORMAL HIGH (ref 70–99)
Glucose-Capillary: 146 mg/dL — ABNORMAL HIGH (ref 70–99)
Glucose-Capillary: 79 mg/dL (ref 70–99)

## 2020-08-24 LAB — MAGNESIUM: Magnesium: 2 mg/dL (ref 1.7–2.4)

## 2020-08-24 LAB — TRIGLYCERIDES: Triglycerides: 117 mg/dL (ref <150)

## 2020-08-24 SURGERY — OPEN REDUCTION INTERNAL FIXATION (ORIF) MANDIBULAR FRACTURE
Anesthesia: General | Site: Mouth

## 2020-08-24 MED ORDER — PROPOFOL 10 MG/ML IV BOLUS
INTRAVENOUS | Status: AC
  Filled 2020-08-24: qty 20

## 2020-08-24 MED ORDER — FENTANYL CITRATE (PF) 250 MCG/5ML IJ SOLN
INTRAMUSCULAR | Status: AC
  Filled 2020-08-24: qty 10

## 2020-08-24 MED ORDER — LIDOCAINE-EPINEPHRINE 1 %-1:100000 IJ SOLN
INTRAMUSCULAR | Status: DC | PRN
  Administered 2020-08-24: 10 mL

## 2020-08-24 MED ORDER — LIDOCAINE-EPINEPHRINE 1 %-1:100000 IJ SOLN
INTRAMUSCULAR | Status: AC
  Filled 2020-08-24: qty 1

## 2020-08-24 MED ORDER — MIDAZOLAM HCL 2 MG/2ML IJ SOLN
INTRAMUSCULAR | Status: DC | PRN
  Administered 2020-08-24: 2 mg via INTRAVENOUS

## 2020-08-24 MED ORDER — LORAZEPAM 2 MG/ML IJ SOLN: 2.0000 mg | INTRAMUSCULAR | Status: AC

## 2020-08-24 MED ORDER — 0.9 % SODIUM CHLORIDE (POUR BTL) OPTIME
TOPICAL | Status: DC | PRN
  Administered 2020-08-24: 1000 mL

## 2020-08-24 MED ORDER — LEVETIRACETAM IN NACL 1000 MG/100ML IV SOLN
1000.0000 mg | Freq: Two times a day (BID) | INTRAVENOUS | Status: DC
  Administered 2020-08-24 – 2020-08-26 (×4): 1000 mg via INTRAVENOUS
  Filled 2020-08-24 (×4): qty 100

## 2020-08-24 MED ORDER — LACTATED RINGERS IV SOLN: INTRAVENOUS | Status: DC | PRN

## 2020-08-24 MED ORDER — CEFAZOLIN SODIUM-DEXTROSE 2-3 GM-%(50ML) IV SOLR
INTRAVENOUS | Status: DC | PRN
  Administered 2020-08-24: 2 g via INTRAVENOUS

## 2020-08-24 MED ORDER — LORAZEPAM 2 MG/ML IJ SOLN
INTRAMUSCULAR | Status: AC
  Administered 2020-08-24: 2 mg via INTRAVENOUS
  Filled 2020-08-24: qty 1

## 2020-08-24 MED ORDER — MIDAZOLAM HCL 2 MG/2ML IJ SOLN
INTRAMUSCULAR | Status: AC
  Filled 2020-08-24: qty 2

## 2020-08-24 MED ORDER — BACITRACIN ZINC 500 UNIT/GM EX OINT
TOPICAL_OINTMENT | CUTANEOUS | Status: AC
  Filled 2020-08-24: qty 28.35

## 2020-08-24 MED ORDER — ONDANSETRON HCL 4 MG/2ML IJ SOLN
INTRAMUSCULAR | Status: DC | PRN
  Administered 2020-08-24: 4 mg via INTRAVENOUS

## 2020-08-24 MED ORDER — OXYMETAZOLINE HCL 0.05 % NA SOLN
NASAL | Status: AC
  Filled 2020-08-24: qty 30

## 2020-08-24 MED ORDER — DEXAMETHASONE SODIUM PHOSPHATE 10 MG/ML IJ SOLN
INTRAMUSCULAR | Status: DC | PRN
  Administered 2020-08-24: 10 mg via INTRAVENOUS
  Administered 2020-08-24: 30 mg via INTRAVENOUS

## 2020-08-24 MED ORDER — ALBUMIN HUMAN 5 % IV SOLN: INTRAVENOUS | Status: DC | PRN

## 2020-08-24 MED ORDER — ROCURONIUM BROMIDE 10 MG/ML (PF) SYRINGE
PREFILLED_SYRINGE | INTRAVENOUS | Status: DC | PRN
  Administered 2020-08-24: 100 mg via INTRAVENOUS

## 2020-08-24 MED ORDER — PROPOFOL 10 MG/ML IV BOLUS
INTRAVENOUS | Status: DC | PRN
  Administered 2020-08-24: 30 mg via INTRAVENOUS

## 2020-08-24 SURGICAL SUPPLY — 70 items
BAR ARCH PREFORM MXLMNDB FX (Miscellaneous) ×1 IMPLANT
BAR FIX PREFORMED OMNIMAX (Miscellaneous) ×2 IMPLANT
BIT DRILL 1.5X50 7MMSTP W/NTCH (BIT) ×1 IMPLANT
BIT DRILL 1.6X50 (BIT) ×2 IMPLANT
BLADE RECIPRO TAPERED (BLADE) IMPLANT
BLADE SURG 10 STRL SS (BLADE) IMPLANT
BLADE SURG 15 STRL LF DISP TIS (BLADE) IMPLANT
BLADE SURG 15 STRL SS (BLADE)
BUR CROSS CUT (BURR) ×2
BUR RND DIAMOND ELITE 3.5 (BURR) IMPLANT
BUR SRG MED 1.2XXCUT FSSR (BURR) ×1 IMPLANT
BUR SURG 4X8 MED (BURR) IMPLANT
BURR SRG MED 1.2XXCUT FSSR (BURR) ×1
BURR SURG 4X8 MED (BURR)
CANISTER SUCT 3000ML PPV (MISCELLANEOUS) ×2 IMPLANT
CLEANER TIP ELECTROSURG 2X2 (MISCELLANEOUS) ×2 IMPLANT
COVER SURGICAL LIGHT HANDLE (MISCELLANEOUS) ×2 IMPLANT
DRAPE HALF SHEET 40X57 (DRAPES) IMPLANT
DRAPE ORTHO SPLIT 87X125 STRL (DRAPES) ×2 IMPLANT
DRILL 1.5X50 7MM STP W/NOTCH (BIT) ×2
ELECT COATED BLADE 2.86 ST (ELECTRODE) ×2 IMPLANT
ELECT NEEDLE BLADE 2-5/6 (NEEDLE) ×2 IMPLANT
ELECT NEEDLE TIP 2.8 STRL (NEEDLE) ×2 IMPLANT
ELECT REM PT RETURN 9FT ADLT (ELECTROSURGICAL) ×2
ELECTRODE REM PT RTRN 9FT ADLT (ELECTROSURGICAL) ×1 IMPLANT
FIBER BOAT ALLOFUSE 7.5CC (Bone Implant) ×2 IMPLANT
GLOVE BIO SURGEON STRL SZ8 (GLOVE) ×10 IMPLANT
GLOVE SRG 8 PF TXTR STRL LF DI (GLOVE) ×3 IMPLANT
GLOVE SURG UNDER POLY LF SZ8 (GLOVE) ×6
GOWN STRL REUS W/ TWL LRG LVL3 (GOWN DISPOSABLE) ×1 IMPLANT
GOWN STRL REUS W/ TWL XL LVL3 (GOWN DISPOSABLE) ×1 IMPLANT
GOWN STRL REUS W/TWL LRG LVL3 (GOWN DISPOSABLE) ×2
GOWN STRL REUS W/TWL XL LVL3 (GOWN DISPOSABLE) ×2
KIT BASIN OR (CUSTOM PROCEDURE TRAY) ×2 IMPLANT
KIT TURNOVER KIT B (KITS) ×2 IMPLANT
MANIFOLD NEPTUNE II (INSTRUMENTS) ×2 IMPLANT
NEEDLE HYPO 25GX1X1/2 BEV (NEEDLE) ×2 IMPLANT
NS IRRIG 1000ML POUR BTL (IV SOLUTION) ×2 IMPLANT
PAD ARMBOARD 7.5X6 YLW CONV (MISCELLANEOUS) ×4 IMPLANT
PATTIES SURGICAL .5 X3 (DISPOSABLE) ×2 IMPLANT
PENCIL BUTTON HOLSTER BLD 10FT (ELECTRODE) ×2 IMPLANT
PLATE 7H STRAIGHT 1.0MM SILVER (Plate) ×2 IMPLANT
PLATE LOCK 12H STR 2.0 BLUE (Plate) ×2 IMPLANT
POSITIONER HEAD DONUT 9IN (MISCELLANEOUS) ×2 IMPLANT
PROTECTOR CORNEAL (OPHTHALMIC RELATED) IMPLANT
SCISSORS WIRE ANG 4 3/4 DISP (INSTRUMENTS) IMPLANT
SCREW BONE 2X7 CROSS DRIVE (Screw) ×10 IMPLANT
SCREW BONE MANDIB SD 2X9 (Screw) ×14 IMPLANT
SCREW LOCKING X-DR 2.0X14 (Screw) ×2 IMPLANT
SCREW LOCKING X-DR 2.0X16 (Screw) ×10 IMPLANT
SCREW NON LOCK X-DR 2.0X18 (Screw) ×2 IMPLANT
SCREW NON LOCK X-DR 2.0X6 (Screw) ×8 IMPLANT
SET WALTER ACTIVATION W/DRAPE (SET/KITS/TRAYS/PACK) ×2 IMPLANT
SUT CHROMIC 3 0 PS 2 (SUTURE) ×6 IMPLANT
SUT CHROMIC 4 0 PS 2 18 (SUTURE) ×4 IMPLANT
SUT ETHILON 5 0 P 3 18 (SUTURE)
SUT MNCRL AB 4-0 PS2 18 (SUTURE) IMPLANT
SUT NYLON ETHILON 5-0 P-3 1X18 (SUTURE) IMPLANT
SUT PLAIN GUT FAST 5-0 (SUTURE) ×4 IMPLANT
SUT PROLENE 5 0 PC 1 (SUTURE) ×2 IMPLANT
SUT PROLENE 5 0 PS 2 (SUTURE) ×2 IMPLANT
SUT SILK 2 0 PERMA HAND 18 BK (SUTURE) IMPLANT
SUT STEEL 0 (SUTURE)
SUT STEEL 0 18XMFL TIE 17 (SUTURE) IMPLANT
SUT STEEL 4 (SUTURE) IMPLANT
SUT VIC AB 3-0 SH 27 (SUTURE) ×2
SUT VIC AB 3-0 SH 27X BRD (SUTURE) ×1 IMPLANT
SUT VICRYL 4-0 PS2 18IN ABS (SUTURE) ×4 IMPLANT
TOWEL GREEN STERILE FF (TOWEL DISPOSABLE) ×2 IMPLANT
TRAY ENT MC OR (CUSTOM PROCEDURE TRAY) ×2 IMPLANT

## 2020-08-24 NOTE — Anesthesia Postprocedure Evaluation (Signed)
Anesthesia Post Note  Patient: Steve Mejia  Procedure(s) Performed: OPEN REDUCTION INTERNAL FIXATION (ORIF) MANDIBULAR FRACTURE AND REPAIR FACIAL LACERATIONS; EXTRACTION OF TEETH #25 AND #26 WITH BONE GRAFT ANTERIOR MANDIBLE. (N/A Mouth)     Patient location during evaluation: SICU Anesthesia Type: General Level of consciousness: sedated Pain management: pain level controlled Vital Signs Assessment: post-procedure vital signs reviewed and stable Respiratory status: patient remains intubated per anesthesia plan Cardiovascular status: stable Postop Assessment: no apparent nausea or vomiting Anesthetic complications: no   No complications documented.  Last Vitals:  Vitals:   08/24/20 1940 08/24/20 2000  BP: 119/80 121/73  Pulse: 81 81  Resp: 18 18  Temp:  37.5 C  SpO2: 100% 100%    Last Pain:  Vitals:   08/24/20 2000  TempSrc: Bladder                 Samirah Scarpati
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
## 2020-08-24 NOTE — Progress Notes (Signed)
Informed Neuro surgeon, Dr. Maisie Fus.  Patient has put out 350 ml urine in the last hour.

## 2020-08-24 NOTE — Progress Notes (Signed)
Turned off all sedation to perform neuro exam

## 2020-08-24 NOTE — Procedures (Signed)
  PROCEDURE: STAT EEG with video 25 minutes  DATES OF TEST: 08/24/20   REASON FOR TEST: seizures  AED/Sedative MEDICATIONS: ativan, propofol, keppra, fentanyl   TECHNIQUE: This is an 18 channel digital EEG recording with time locked video done using the standard international 10/20 system of electrode placement with one channel EKG recording. Pt was comatose during this recording.  FINDINGS:  Background rhythm: symmetric/asymmetric diffuse/left/right 1-3 Hz Amplitude : low (<20 microvolts) Continuity: continuous  nearly (10-49%) Breach effect:  NO Variability: YES Reactivity: NO Rhythmic delta activity: NO Periodic discharges: NO Sporadic epileptiform discharges: NO Electrographic/electroclinical seizures: NO  Limited EKG reveals no abnormalities.    IMPRESSION:  This is an abnormal study due to - 1. Severe Generalized slowing is a non-specific finding consistent with a generalized disturbance of cerebral functioning including toxic, metabolic, or structural abnormalities that are multi-focal or diffuse. 2. No definite epileptiform discharges or electrographic seizures noted.

## 2020-08-24 NOTE — Anesthesia Postprocedure Evaluation (Signed)
Anesthesia Post Note  Patient: Steve Mejia  Procedure(s) Performed: MEDIAN STERNOTOMY (N/A Chest) REPAIR OF RIGHT ATRIAL LACERATION (N/A Chest)     Patient location during evaluation: SICU Anesthesia Type: General Level of consciousness: sedated Pain management: pain level controlled Vital Signs Assessment: post-procedure vital signs reviewed and stable Respiratory status: patient remains intubated per anesthesia plan Cardiovascular status: stable Postop Assessment: no apparent nausea or vomiting Anesthetic complications: no   No complications documented.  Last Vitals:  Vitals:   08/24/20 1145 08/24/20 1200  BP: (!) 142/109 (!) 148/111  Pulse: 77 73  Resp: 11 18  Temp: (!) 35.6 C (!) 35.8 C  SpO2: 100% 100%    Last Pain:  Vitals:   08/24/20 1200  TempSrc: Bladder                 [name]

## 2020-08-24 NOTE — Progress Notes (Signed)
Patient back to unit.

## 2020-08-24 NOTE — Progress Notes (Signed)
Sent message to pharm To verify ativan to administer stat dose.  Patient still twitching both shoulders.

## 2020-08-24 NOTE — Care Plan (Signed)
EEG reviewed at time of pushbutton events reported by nursing (19:13 and 23:20).  EEG appears consistent with diffuse encephalopathy without evidence of seizure activity at these times.  No change in care plan at this time.  Brooke Dare MD-PhD [facility] [phone]

## 2020-08-24 NOTE — Progress Notes (Signed)
LTM in process, Result pending
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
## 2020-08-24 NOTE — Progress Notes (Signed)
[address]       [name] [address]             [phone]        CARDIOTHORACIC SURGERY PROGRESS NOTE   RDay of Surgery Procedure(s) (LRB): OPEN REDUCTION INTERNAL FIXATION (ORIF) MANDIBULAR FRACTURE AND REPAIR FACIAL LACERATIONS; EXTRACTION OF TEETH #25 AND #26 WITH BONE GRAFT ANTERIOR MANDIBLE. (N/A)  Subjective: Just returned from OR, sedated on vent.  Reportedly w/ some purposeful movement w/ stimulation early this morning but still not following commands.  Objective: Vital signs: BP Readings from Last 1 Encounters:  08/24/20 114/67   Pulse Readings from Last 1 Encounters:  08/24/20 86   Resp Readings from Last 1 Encounters:  08/24/20 17   Temp Readings from Last 1 Encounters:  08/24/20 (!) 95.9 F (35.5 C)    Hemodynamics:    Physical Exam:  Rhythm:   sinus  Breath sounds: clear  Heart sounds:  RRR  Incisions:  Dressing dry, intact  Abdomen:  Soft, non-distended  Extremities:  Warm, well-perfused  Chest tubes:  low volume thin serosanguinous output, no air leak    Intake/Output from previous day: 04/09 0701 - 04/10 0700 In: 3132.9 [I.V.:2759.3; NG/GT:90; IV Piggyback:283.6] Out: 3055 [Urine:2325; Emesis/NG output:450; Chest Tube:280] Intake/Output this shift: Total I/O In: 1050 [I.V.:800; IV Piggyback:250] Out: 315 [Urine:300; Blood:15]  Lab Results:  CBC: Recent Labs    08/23/20 0153 08/23/20 0200 08/23/20 0500 08/23/20 0529  WBC 9.9  --  14.9*  --   HGB 14.4   < > 16.7 16.3  HCT 47.0   < > 50.3 48.0  PLT 206  --  172  --    < > = values in this interval not displayed.    BMET:  Recent Labs    08/23/20 0211 08/23/20 0241 08/23/20 0327 08/23/20 0426 08/23/20 0500 08/23/20 0529  NA 141   < > 143   < > 141 140  K 3.0*   < > 3.5   < > 5.1 4.9  CL 108  --  109  --  108  --   CO2 16*  --   --   --  20*  --   GLUCOSE 115*  --  130*  --  110*  --   BUN 9  --  8  --  8  --   CREATININE 1.52*  --  1.20  --   1.29*  --   CALCIUM 8.6*  --   --   --  7.9*  --    < > = values in this interval not displayed.     PT/INR:   Recent Labs    08/23/20 0500  LABPROT 15.7*  INR 1.3*    CBG (last 3)  Recent Labs    08/23/20 1924 08/23/20 2346 08/24/20 0352  GLUCAP 85 97 89    ABG    Component Value Date/Time   PHART 7.416 08/23/2020 0529   PCO2ART 31.4 (L) 08/23/2020 0529   PO2ART 269 (H) 08/23/2020 0529   HCO3 20.5 08/23/2020 0529   TCO2 21 (L) 08/23/2020 0529   ACIDBASEDEF 3.0 (H) 08/23/2020 0529   O2SAT 100.0 08/23/2020 0529    CXR: n/a  Assessment/Plan: S/P Procedure(s) (LRB): OPEN REDUCTION INTERNAL FIXATION (ORIF) MANDIBULAR FRACTURE AND REPAIR FACIAL LACERATIONS; EXTRACTION OF TEETH #25 AND #26 WITH BONE GRAFT ANTERIOR MANDIBLE. (N/A)  Clinically stable from cardiac standpoint s/p emergency median sternotomy for repair of traumatic laceration  of right atrium. Closed head injury w/ cerebral contusions, neuro status somewhat improved S/P ORIF mandibular fracture   D/C chest tubes  D/C A-line  Remainder of care per trauma service  Purcell Nails, MD 08/24/2020 11:31 AM
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
## 2020-08-24 NOTE — Progress Notes (Signed)
Dr. Beatrix Fetters at bed side.  Neuro exam performed.  Pupils equal sluggish.  Patient started muscle twitching in right shoulder then. Instructed to restart propofol and EEG ordered.  Also stat Ativan 2 MG IV.
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
## 2020-08-24 NOTE — Progress Notes (Signed)
EEG complete, LTM continues - results pending

## 2020-08-24 NOTE — Progress Notes (Signed)
Patient no longer twitching.  Propfol at .

## 2020-08-24 NOTE — Progress Notes (Signed)
Sedation off.  Patient unresponsive to pain, Has Pupils reactive brisk  and corneal's, No cough or gag. Patient having subtle head tremors.  Alerted EEG and spoke with EEG tech, explained head tremors was assessed.  EEG tech states she will notify MD he will assess video and EEG.

## 2020-08-24 NOTE — Progress Notes (Signed)
Pausing sedation to check neuro status.

## 2020-08-24 NOTE — Progress Notes (Signed)
I was asked to assess Steve Mejia pupillary reaction. Both pupils are 66mm and are sluggish when reacting to light. Pt sedated on Propofol. No obvious seizure activity. When both eyes are opened artificially, both eyelids twitch during stimulation. The twitching stops when pt is not stimulated.

## 2020-08-24 NOTE — Progress Notes (Signed)
RN spoke with Neurology Iver Nestle, MD), in regards to loss of gag during suctioning. She informed me that it was noted in [place], MD note that was unable to assess d/t the recent surgery. Therefore, if this was the only noted change at this time to continue sedation holidays for q2hr neuro checks, until status epilepticus is confirmed. At that time we will hold off on sedation holidays and continue to monitor the patients neurological status.

## 2020-08-24 NOTE — Progress Notes (Signed)
Ativan pulled emergently, verified by second RN, Kathrine.  Administer 2mg  ativan IV  As per order.  Dr. instructed to increase propofol until twitching (seizure like activity) has subsided.  Restarted fentanyl as well.  Instructed by neurosurgeon to Pause sedation every two hours for five minutes to assess for neuro status and any seizure like activity.

## 2020-08-24 NOTE — Progress Notes (Signed)
EEG tech called and will be by to place patient on the EEG monitor.

## 2020-08-24 NOTE — Progress Notes (Signed)
OR staff at bedside given report and patient off unit to OR.

## 2020-08-24 NOTE — Progress Notes (Signed)
Paged Neurosurgeon Dr. Pecolia Ades.    Instructed to leave off sedation and her will come to bedside to perform exam.

## 2020-08-24 NOTE — Transfer of Care (Signed)
Immediate Anesthesia Transfer of Care Note  Patient: Steve Mejia  Procedure(s) Performed: OPEN REDUCTION INTERNAL FIXATION (ORIF) MANDIBULAR FRACTURE AND REPAIR FACIAL LACERATIONS; EXTRACTION OF TEETH #25 AND #26 WITH BONE GRAFT ANTERIOR MANDIBLE. (N/A Mouth)  Patient Location: SICU  Anesthesia Type:General  Level of Consciousness: Patient remains intubated per anesthesia plan  Airway & Oxygen Therapy: Patient remains intubated per anesthesia plan  Post-op Assessment: Report given to RN and Post -op Vital signs reviewed and stable  Post vital signs: Reviewed and stable  Last Vitals:  Vitals Value Taken Time  BP    Temp 35.4 C 08/24/20 1117  Pulse 86 08/24/20 1117  Resp 28 08/24/20 1117  SpO2 100 % 08/24/20 1117  Vitals shown include unvalidated device data.  Last Pain:  Vitals:   08/24/20 0400  TempSrc: Bladder         Complications: No complications documented.

## 2020-08-24 NOTE — Progress Notes (Addendum)
Paged Neurology.  Patient no longer has cough while off sedation for neuro checks.  Still has pupils and corneal.  Patient has eye twitching and head twitching when sedation is off and continued no response to pain.  EEG tech notified of seizure like activity.   Restarted sedation.

## 2020-08-24 NOTE — Anesthesia Preprocedure Evaluation (Addendum)
Anesthesia Evaluation  Patient identified by MRN, date of birth, ID band Patient unresponsive    Reviewed: Unable to perform ROS - Chart review only  History of Anesthesia Complications Negative for: history of anesthetic complications  Airway Mallampati: Intubated       Dental  (+) Poor Dentition, Missing, Chipped, Loose   Pulmonary    breath sounds clear to auscultation       Cardiovascular  Rhythm:Regular  S/p median sternotomy for left atrial appendage laceration to evacuate hemopericardium    Neuro/Psych EEG due to diffuse slowing with overriding fast.  Diffusely might be due to generalized brain dysfunction and excessive fast activity likely medication effect. No seizures or epileptogenicity. MRI brain completed and reviewed There is a subtle area of cortical edema in the left anterior and superior temporal lobe in the inferior frontal lobe which is associated with small areas of hemorrhage-likely contusion.  In addition there is small subdural hematoma anterior to the temporal lobe.  There is also abnormal diffusion in the corpus callosum-concerning for shearing injury/TBI.    GI/Hepatic   Endo/Other    Renal/GU      Musculoskeletal   Abdominal   Peds  Hematology   Anesthesia Other Findings Intraoral exam: Limited 2/2 ET tube Mobile aveolary segment containing teeth #25, 26 Teeth #23, 24 fractured  Tooth #27 avulsed Significant gingival laceration  Reproductive/Obstetrics                            Anesthesia Physical Anesthesia Plan  ASA: IV  Anesthesia Plan: General   Post-op Pain Management:    Induction: Intravenous and Inhalational  PONV Risk Score and Plan: 2 and Treatment may vary due to age or medical condition  Airway Management Planned: Nasal ETT, Oral ETT and Video Laryngoscope Planned  Additional Equipment:   Intra-op Plan:   Post-operative Plan:  Post-operative intubation/ventilation  Informed Consent:     History available from chart only  Plan Discussed with: CRNA and Surgeon  Anesthesia Plan Comments:         Anesthesia Quick Evaluation

## 2020-08-25 ENCOUNTER — Inpatient Hospital Stay (HOSPITAL_COMMUNITY): Payer: Medicaid Other

## 2020-08-25 ENCOUNTER — Inpatient Hospital Stay: Payer: Self-pay

## 2020-08-25 ENCOUNTER — Encounter (HOSPITAL_COMMUNITY): Payer: Self-pay | Admitting: Thoracic Surgery (Cardiothoracic Vascular Surgery)

## 2020-08-25 DIAGNOSIS — I313 Pericardial effusion (noninflammatory): Secondary | ICD-10-CM

## 2020-08-25 DIAGNOSIS — I314 Cardiac tamponade: Secondary | ICD-10-CM

## 2020-08-25 LAB — AMMONIA: Ammonia: 27 umol/L (ref 9–35)

## 2020-08-25 LAB — GLUCOSE, CAPILLARY
Glucose-Capillary: 105 mg/dL — ABNORMAL HIGH (ref 70–99)
Glucose-Capillary: 108 mg/dL — ABNORMAL HIGH (ref 70–99)
Glucose-Capillary: 110 mg/dL — ABNORMAL HIGH (ref 70–99)
Glucose-Capillary: 112 mg/dL — ABNORMAL HIGH (ref 70–99)
Glucose-Capillary: 118 mg/dL — ABNORMAL HIGH (ref 70–99)
Glucose-Capillary: 129 mg/dL — ABNORMAL HIGH (ref 70–99)
Glucose-Capillary: 130 mg/dL — ABNORMAL HIGH (ref 70–99)

## 2020-08-25 LAB — POCT I-STAT 7, (LYTES, BLD GAS, ICA,H+H)
Acid-base deficit: 5 mmol/L — ABNORMAL HIGH (ref 0.0–2.0)
Bicarbonate: 19.5 mmol/L — ABNORMAL LOW (ref 20.0–28.0)
Calcium, Ion: 1.22 mmol/L (ref 1.15–1.40)
HCT: 42 % (ref 39.0–52.0)
Hemoglobin: 14.3 g/dL (ref 13.0–17.0)
O2 Saturation: 99 %
Patient temperature: 100.2
Potassium: 3.6 mmol/L (ref 3.5–5.1)
Sodium: 140 mmol/L (ref 135–145)
TCO2: 21 mmol/L — ABNORMAL LOW (ref 22–32)
pCO2 arterial: 37.2 mmHg (ref 32.0–48.0)
pH, Arterial: 7.333 — ABNORMAL LOW (ref 7.350–7.450)
pO2, Arterial: 144 mmHg — ABNORMAL HIGH (ref 83.0–108.0)

## 2020-08-25 LAB — BASIC METABOLIC PANEL
Anion gap: 7 (ref 5–15)
BUN: 10 mg/dL (ref 6–20)
CO2: 21 mmol/L — ABNORMAL LOW (ref 22–32)
Calcium: 8.6 mg/dL — ABNORMAL LOW (ref 8.9–10.3)
Chloride: 110 mmol/L (ref 98–111)
Creatinine, Ser: 1.11 mg/dL (ref 0.61–1.24)
GFR, Estimated: >60 mL/min (ref >60)
Glucose, Bld: 123 mg/dL — ABNORMAL HIGH (ref 70–99)
Potassium: 3.7 mmol/L (ref 3.5–5.1)
Sodium: 138 mmol/L (ref 135–145)

## 2020-08-25 LAB — CBC
HCT: 44 % (ref 39.0–52.0)
Hemoglobin: 14.6 g/dL (ref 13.0–17.0)
MCH: 28.3 pg (ref 26.0–34.0)
MCHC: 33.2 g/dL (ref 30.0–36.0)
MCV: 85.3 fL (ref 80.0–100.0)
Platelets: 145 10*3/uL — ABNORMAL LOW (ref 150–400)
RBC: 5.16 MIL/uL (ref 4.22–5.81)
RDW: 13.1 % (ref 11.5–15.5)
WBC: 10.5 10*3/uL (ref 4.0–10.5)
nRBC: 0 % (ref 0.0–0.2)

## 2020-08-25 LAB — MAGNESIUM
Magnesium: 1.8 mg/dL (ref 1.7–2.4)
Magnesium: 1.8 mg/dL (ref 1.7–2.4)

## 2020-08-25 LAB — ECHOCARDIOGRAM COMPLETE
Area-P 1/2: 3.83 cm2
Height: 74 in
S' Lateral: 2.7 cm
Weight: 2469.15 oz

## 2020-08-25 LAB — PHOSPHORUS
Phosphorus: 3.4 mg/dL (ref 2.5–4.6)
Phosphorus: 3.7 mg/dL (ref 2.5–4.6)

## 2020-08-25 MED ORDER — PANTOPRAZOLE SODIUM 40 MG IV SOLR
40.0000 mg | Freq: Every day | INTRAVENOUS | Status: DC
  Administered 2020-08-25 – 2020-08-28 (×4): 40 mg via INTRAVENOUS
  Filled 2020-08-25 (×4): qty 40

## 2020-08-25 MED ORDER — SODIUM CHLORIDE 0.9% FLUSH
10.0000 mL | Freq: Two times a day (BID) | INTRAVENOUS | Status: DC
  Administered 2020-08-25 – 2020-08-26 (×4): 10 mL
  Administered 2020-08-27: 20 mL
  Administered 2020-08-27 – 2020-08-29 (×4): 10 mL
  Administered 2020-08-29: 20 mL
  Administered 2020-08-30 – 2020-08-31 (×4): 10 mL
  Administered 2020-09-01: 20 mL
  Administered 2020-09-02 – 2020-09-05 (×5): 10 mL

## 2020-08-25 MED ORDER — VITAL HIGH PROTEIN PO LIQD
1000.0000 mL | ORAL | Status: DC
  Administered 2020-08-25: 1000 mL

## 2020-08-25 MED ORDER — DOCUSATE SODIUM 50 MG/5ML PO LIQD
200.0000 mg | Freq: Every day | ORAL | Status: DC
  Administered 2020-08-25 – 2020-08-26 (×2): 200 mg
  Filled 2020-08-25: qty 20

## 2020-08-25 MED ORDER — PIVOT 1.5 CAL PO LIQD
1000.0000 mL | ORAL | Status: DC
  Administered 2020-08-25 – 2020-08-28 (×5): 1000 mL

## 2020-08-25 MED ORDER — NITROGLYCERIN IN D5W 200-5 MCG/ML-% IV SOLN
INTRAVENOUS | Status: AC
  Filled 2020-08-25: qty 250

## 2020-08-25 MED ORDER — DOCUSATE SODIUM 50 MG/5ML PO LIQD: 200.0000 mg | Freq: Every day | ORAL | Status: DC

## 2020-08-25 MED ORDER — PROSOURCE TF PO LIQD
45.0000 mL | Freq: Two times a day (BID) | ORAL | Status: DC
  Administered 2020-08-25: 45 mL
  Filled 2020-08-25: qty 45

## 2020-08-25 MED ORDER — DOCUSATE SODIUM 50 MG/5ML PO LIQD
200.0000 mg | Freq: Every day | ORAL | Status: DC
  Filled 2020-08-25: qty 20

## 2020-08-25 MED ORDER — SODIUM CHLORIDE 0.9% FLUSH: 10.0000 mL | INTRAVENOUS | Status: DC | PRN

## 2020-08-25 MED FILL — Heparin Sodium (Porcine) Inj 1000 Unit/ML: INTRAMUSCULAR | Qty: 30 | Status: AC

## 2020-08-25 MED FILL — Sodium Chloride IV Soln 0.9%: INTRAVENOUS | Qty: 3000 | Status: AC
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
## 2020-08-25 NOTE — Progress Notes (Signed)
      [address]       [name] [address]             [phone]      S/p repair of myocardial laceration sec to blunt trauma  Intubated  BP 116/69   Pulse 80   Temp (!) 101.12 F (38.4 C)   Resp 17   Ht 6\' 2"  (1.88 m)   Wt 70 kg   SpO2 100%   BMI 19.81 kg/m   Neuro issues noted  EEG in progress  Jeb Schloemer C. , MD [facility] [phone]

## 2020-08-25 NOTE — Progress Notes (Addendum)
Turned off sedation to assess patient neuro status.  Patient's mother and grandmother in the room.    Mother called patient's name.  Patient turned his head to her voice (to the left), looked at her and smiled.  Patient's eyes started looking toward the right   then started jerking throughout his body intermittently.  Patient did not follow commands.  Restarted sedation.

## 2020-08-25 NOTE — Progress Notes (Signed)
Patient BP 152/87 HR 86 administered 5 mg metoprolol IV as per MD orders.

## 2020-08-25 NOTE — Progress Notes (Addendum)
Subjective: Multiple events, no seizures reocrded on EEG  Exam: Vitals:   08/25/20 0700 08/25/20 0757  BP: 122/71 122/71  Pulse: 81 90  Resp: 18   Temp: 100.2 F (37.9 C)   SpO2: 100% 100%   Gen: In bed, NAD Resp: non-labored breathing, no acute distress Abd: soft, nt  Neuro: MS: opens eyes partially to voice, does not follow commands. He does appear to fixate me once, but not reliably.  CN: Eyes with right gaze preference, though he does come back towards midline with positioning. PERRL, corneals present bilaterally, cough/gag present.  Motor: extension to noxious stimulaiton on the right, flexion on the left.  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 The movements captures are not consistent with seizure, but there was a witnessed event by the initial neurologist concerning for such. I would favor intermediate term AED therapy, but may not need long term therapy.   Recommendations: 1) continue keppra 500mg  BID 2) will continue EEG for now, if seizure free by tomorrow, could likely d/c  This patient is critically ill and at significant risk of neurological worsening, death and care requires constant monitoring of vital signs, hemodynamics,respiratory and cardiac monitoring, neurological assessment, discussion with family, other specialists and medical decision making of high complexity. I spent 35 minutes of neurocritical care time  in the care of  this patient. This was time spent independent of any time provided by nurse practitioner or PA.  , MD [facility] [phone]  If 7pm- 7am, please page neurology on call as listed in AMION. 08/25/2020  7:46 PM

## 2020-08-25 NOTE — Progress Notes (Signed)
Patient more agitated, increased tremors  And right sided gaze.  Increased propofol to 50
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
## 2020-08-25 NOTE — Progress Notes (Signed)
      [address]       Steve Mej[place] [address]             [phone]        CARDIOTHORACIC SURGERY PROGRESS NOTE   R1 Day Post-Op Procedure(s) (LRB): OPEN REDUCTION INTERNAL FIXATION (ORIF) MANDIBULAR FRACTURE AND REPAIR FACIAL LACERATIONS; EXTRACTION OF TEETH #25 AND #26 WITH BONE GRAFT ANTERIOR MANDIBLE. (N/A)  Subjective: Sedated on vent  Objective: Vital signs: BP Readings from Last 1 Encounters:  08/25/20 122/71   Pulse Readings from Last 1 Encounters:  08/25/20 90   Resp Readings from Last 1 Encounters:  08/25/20 18   Temp Readings from Last 1 Encounters:  08/25/20 100.2 F (37.9 C)    Hemodynamics:    Physical Exam:  Rhythm:   sinus  Breath sounds: Coarse but clear  Heart sounds:  RRR  Incisions:  Dressing dry, intact  Abdomen:  Soft, non-distended, quiet  Extremities:  Warm, well-perfused   Intake/Output from previous day: 04/10 0701 - 04/11 0700 In: 3745.1 [I.V.:3393.2; IV Piggyback:351.9] Out: 2976 [Urine:2885; Emesis/NG output:75; Stool:1; Blood:15] Intake/Output this shift: Total I/O In: 280.8 [I.V.:182.2; IV Piggyback:98.6] Out: 75 [Urine:75]  Lab Results:  CBC: Recent Labs    08/24/20 1653 08/25/20 0757 08/25/20 0805  WBC 10.5  --  10.5  HGB 15.4 14.3 14.6  HCT 46.5 42.0 44.0  PLT 150  --  145*    BMET:  Recent Labs    08/24/20 1653 08/25/20 0757 08/25/20 0805  NA 140 140 138  K 4.4 3.6 3.7  CL 113*  --  110  CO2 19*  --  21*  GLUCOSE 155*  --  123*  BUN 10  --  10  CREATININE 1.29*  --  1.11  CALCIUM 8.8*  --  8.6*     PT/INR:   Recent Labs    08/23/20 0500  LABPROT 15.7*  INR 1.3*    CBG (last 3)  Recent Labs    08/25/20 0029 08/25/20 0409 08/25/20 0745  GLUCAP 129* 105* 130*    ABG    Component Value Date/Time   PHART 7.333 (L) 08/25/2020 0757   PCO2ART 37.2 08/25/2020 0757   PO2ART 144 (H) 08/25/2020 0757   HCO3 19.5 (L) 08/25/2020 0757   TCO2 21 (L) 08/25/2020 0757    ACIDBASEDEF 5.0 (H) 08/25/2020 0757   O2SAT 99.0 08/25/2020 0757    CXR: PORTABLE CHEST 1 VIEW  COMPARISON:  Chest x-ray yesterday 08/24/2020  FINDINGS: Stable position of endotracheal tube approximately 7.4 cm above the carina. Gastric tube also in unchanged position. The proximal side-hole overlies the GE junction. Cardiac and mediastinal contours remain stable. The lungs are clear. No pleural effusion or pneumothorax.  IMPRESSION: 1. Unchanged support apparatus. 2. The lungs remain clear.   Electronically Signed   By: Malachy Moan M.D.   On: 08/25/2020 07:36   Assessment/Plan: S/P Procedure(s) (LRB): OPEN REDUCTION INTERNAL FIXATION (ORIF) MANDIBULAR FRACTURE AND REPAIR FACIAL LACERATIONS; EXTRACTION OF TEETH #25 AND #26 WITH BONE GRAFT ANTERIOR MANDIBLE. (N/A)  Clinically stable Long term prognosis ultimately to be determined by potential for neurologic recovery Might benefit from early tracheostomy Will defer management to trauma and neurology teams Please call if we can be of further assistance   Purcell Nails, MD 08/25/2020 9:57 AM

## 2020-08-25 NOTE — Plan of Care (Signed)
  Problem: Clinical Measurements: Goal: Ability to maintain clinical measurements within normal limits will improve Outcome: Progressing Goal: Will remain free from infection Outcome: Progressing Goal: Respiratory complications will improve Outcome: Progressing Goal: Cardiovascular complication will be avoided Outcome: Progressing   Problem: Elimination: Goal: Will not experience complications related to bowel motility Outcome: Progressing Goal: Will not experience complications related to urinary retention Outcome: Progressing   Problem: Pain Managment: Goal: General experience of comfort will improve Outcome: Progressing   Problem: Safety: Goal: Ability to remain free from injury will improve Outcome: Progressing   Problem: Skin Integrity: Goal: Risk for impaired skin integrity will decrease Outcome: Progressing   Problem: Clinical Measurements: Goal: Diagnostic test results will improve Outcome: Not Progressing   Problem: Activity: Goal: Risk for activity intolerance will decrease Outcome: Not Progressing   Problem: Nutrition: Goal: Adequate nutrition will be maintained Outcome: Not Progressing
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
## 2020-08-25 NOTE — Progress Notes (Signed)
  Echocardiogram 2D Echocardiogram has been performed.  Steve Mejia 08/25/2020, 10:37 AM

## 2020-08-25 NOTE — Progress Notes (Signed)
Verbal order received.  Protonix  40mg  IV daily.  Dr. 

## 2020-08-25 NOTE — Progress Notes (Signed)
Patient resting quietly in bed no tremors noted.

## 2020-08-25 NOTE — Progress Notes (Signed)
1 Day Post-Op   Subjective/Chief Complaint: Patient remains intubated and sedated in the ICU. Neuro status remains unclear.    Objective:  Maxillofacial Exam: ETT secured in place Mild submental edema, soft Laceration repairs intact without dehiscence or crusting  Intraoral exam: Limited 2/2 ET tube  Sutures intact would dehiscence Unable to assess occlusion  Wounds hemostatic  Vital signs in last 24 hours: Temp:  [96.98 F (36.1 C)-100.22 F (37.9 C)] 100.22 F (37.9 C) (04/11 1100) Pulse Rate:  [72-105] 105 (04/11 1205) Resp:  [16-21] 18 (04/11 1100) BP: (109-155)/(70-123) 119/70 (04/11 1205) SpO2:  [98 %-100 %] 98 % (04/11 1100) Arterial Line BP: (151-168)/(92-104) 168/102 (04/10 1500) FiO2 (%):  [30 %-40 %] 30 % (04/11 1205) Last BM Date: 08/25/20  Intake/Output from previous day: 04/10 0701 - 04/11 0700 In: 3745.1 [I.V.:3393.2; IV Piggyback:351.9] Out: 2976 [Urine:2885; Emesis/NG output:75; Stool:1; Blood:15] Intake/Output this shift: Total I/O In: 280.8 [I.V.:182.2; IV Piggyback:98.6] Out: 275 [Urine:275]    Lab Results:  Recent Labs    08/24/20 1653 08/25/20 0757 08/25/20 0805  WBC 10.5  --  10.5  HGB 15.4 14.3 14.6  HCT 46.5 42.0 44.0  PLT 150  --  145*   BMET Recent Labs    08/24/20 1653 08/25/20 0757 08/25/20 0805  NA 140 140 138  K 4.4 3.6 3.7  CL 113*  --  110  CO2 19*  --  21*  GLUCOSE 155*  --  123*  BUN 10  --  10  CREATININE 1.29*  --  1.11  CALCIUM 8.8*  --  8.6*   PT/INR Recent Labs    08/23/20 0211 08/23/20 0500  LABPROT 16.4* 15.7*  INR 1.4* 1.3*   ABG Recent Labs    08/23/20 0529 08/25/20 0757  PHART 7.416 7.333*  HCO3 20.5 19.5*    Studies/Results: CXR with good position of NG and ETT after tube switch procedure  Anti-infectives: Anti-infectives (From admission, onward)   Start     Dose/Rate Route Frequency Ordered Stop   08/23/20 0900  ceFAZolin (ANCEF) IVPB 1 g/50 mL premix        1 g 100 mL/hr  over 30 Minutes Intravenous Every 8 hours 08/23/20 0440 08/23/20 1727      Assessment/Plan: s/p Procedure(s): OPEN REDUCTION INTERNAL FIXATION (ORIF) MANDIBULAR FRACTURE AND REPAIR FACIAL LACERATIONS; EXTRACTION OF TEETH #25 AND #26 WITH BONE GRAFT ANTERIOR MANDIBLE. (N/A)   Recommendations: 1) Diet to be determined after neuro status becomes more clear, if able to take PO, MUST maintain full liquid diet x 6 weeks. Recommend clears until POD#7.  2) Bacitracin to lip lacerations until POD#3, then Vaseline to keep wounds moist. 3) Peridex oral rinses/gentle oral sponge applications BID 4) Continue with Ancef until POD #7. If able to take PO, ok to switch to amoxicillin.   Will follow up in my office one week after discharge. I will continue to follow    LOS: 2 days    Exie Parody 08/25/2020
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
## 2020-08-25 NOTE — Progress Notes (Signed)
Peripherally Inserted Central Catheter Placement  The IV Nurse has discussed with the patient and/or persons authorized to consent for the patient, the purpose of this procedure and the potential benefits and risks involved with this procedure.  The benefits include less needle sticks, lab draws from the catheter, and the patient may be discharged home with the catheter. Risks include, but not limited to, infection, bleeding, blood clot (thrombus formation), and puncture of an artery; nerve damage and irregular heartbeat and possibility to perform a PICC exchange if needed/ordered by physician.  Alternatives to this procedure were also discussed.  Bard Power PICC patient education guide, fact sheet on infection prevention and patient information card has been provided to patient /or left at bedside.    PICC Placement Documentation  PICC Triple Lumen 08/25/20 PICC Right Brachial 37 cm 0 cm (Active)  Indication for Insertion or Continuance of Line Prolonged intravenous therapies 08/25/20 1230  Exposed Catheter (cm) 0 cm 08/25/20 1230  Site Assessment Clean;Dry;Intact 08/25/20 1230  Lumen #1 Status Flushed;Blood return noted;Saline locked 08/25/20 1230  Lumen #2 Status Flushed;Blood return noted;Saline locked 08/25/20 1230  Lumen #3 Status Flushed;Blood return noted;Saline locked 08/25/20 1230  Dressing Type Transparent 08/25/20 1230  Dressing Status Clean;Dry;Intact 08/25/20 1230  Antimicrobial disc in place? Yes 08/25/20 1230  Dressing Change Due 09/01/20 08/25/20 1230       Audrie Gallus 08/25/2020, 12:44 PM

## 2020-08-25 NOTE — Progress Notes (Signed)
Vascular nurse at bedside attemting to place PICC line.  Patient restless.  Continue twitching and jerking action through out body.  HR elevated 110.  Increased propofol to 50 then 60.  Increased Fentanyl to .
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
## 2020-08-25 NOTE — Progress Notes (Signed)
Elink recommending patient should be started on something for stress ulcers.  Paged Dr. Janee Morn requesting medication.

## 2020-08-25 NOTE — Progress Notes (Deleted)
Patient c/o constant midsternal chest heaviness.  Paged cardiology

## 2020-08-25 NOTE — Progress Notes (Signed)
Dr. Janee Morn at bedside during neuro assessment with sedation on pause.  New orders received.  Patient responding to pain, with any stimulation patient starts full body tremoring and pulling his extermities into the core of his body.    Advanced NGT 10 cm as per Dr. Janee Morn order.

## 2020-08-26 ENCOUNTER — Inpatient Hospital Stay (HOSPITAL_COMMUNITY): Payer: Medicaid Other

## 2020-08-26 ENCOUNTER — Inpatient Hospital Stay (HOSPITAL_COMMUNITY): Payer: Medicaid Other | Admitting: Certified Registered"

## 2020-08-26 ENCOUNTER — Encounter (HOSPITAL_COMMUNITY): Payer: Self-pay | Admitting: Oral Surgery

## 2020-08-26 DIAGNOSIS — S069X9A Unspecified intracranial injury with loss of consciousness of unspecified duration, initial encounter: Secondary | ICD-10-CM

## 2020-08-26 LAB — POCT I-STAT 7, (LYTES, BLD GAS, ICA,H+H)
Acid-base deficit: 8 mmol/L — ABNORMAL HIGH (ref 0.0–2.0)
Bicarbonate: 15.4 mmol/L — ABNORMAL LOW (ref 20.0–28.0)
Calcium, Ion: 0.99 mmol/L — ABNORMAL LOW (ref 1.15–1.40)
HCT: 31 % — ABNORMAL LOW (ref 39.0–52.0)
Hemoglobin: 10.5 g/dL — ABNORMAL LOW (ref 13.0–17.0)
O2 Saturation: 99 %
Patient temperature: 38.2
Potassium: 2.7 mmol/L — CL (ref 3.5–5.1)
Sodium: 126 mmol/L — ABNORMAL LOW (ref 135–145)
TCO2: 16 mmol/L — ABNORMAL LOW (ref 22–32)
pCO2 arterial: 27.7 mmHg — ABNORMAL LOW (ref 32.0–48.0)
pH, Arterial: 7.359 (ref 7.350–7.450)
pO2, Arterial: 135 mmHg — ABNORMAL HIGH (ref 83.0–108.0)

## 2020-08-26 LAB — CBC WITH DIFFERENTIAL/PLATELET
Abs Immature Granulocytes: 0.06 10*3/uL (ref 0.00–0.07)
Basophils Absolute: 0 10*3/uL (ref 0.0–0.1)
Basophils Relative: 0 %
Eosinophils Absolute: 0.1 10*3/uL (ref 0.0–0.5)
Eosinophils Relative: 1 %
HCT: 42.4 % (ref 39.0–52.0)
Hemoglobin: 14 g/dL (ref 13.0–17.0)
Immature Granulocytes: 1 %
Lymphocytes Relative: 13 %
Lymphs Abs: 1.1 10*3/uL (ref 0.7–4.0)
MCH: 28.8 pg (ref 26.0–34.0)
MCHC: 33 g/dL (ref 30.0–36.0)
MCV: 87.2 fL (ref 80.0–100.0)
Monocytes Absolute: 0.7 10*3/uL (ref 0.1–1.0)
Monocytes Relative: 8 %
Neutro Abs: 6.7 10*3/uL (ref 1.7–7.7)
Neutrophils Relative %: 77 %
Platelets: 132 10*3/uL — ABNORMAL LOW (ref 150–400)
RBC: 4.86 MIL/uL (ref 4.22–5.81)
RDW: 13 % (ref 11.5–15.5)
WBC: 8.7 10*3/uL (ref 4.0–10.5)
nRBC: 0 % (ref 0.0–0.2)

## 2020-08-26 LAB — BASIC METABOLIC PANEL
Anion gap: 8 (ref 5–15)
BUN: 12 mg/dL (ref 6–20)
CO2: 22 mmol/L (ref 22–32)
Calcium: 8.4 mg/dL — ABNORMAL LOW (ref 8.9–10.3)
Chloride: 109 mmol/L (ref 98–111)
Creatinine, Ser: 0.9 mg/dL (ref 0.61–1.24)
GFR, Estimated: >60 mL/min (ref >60)
Glucose, Bld: 131 mg/dL — ABNORMAL HIGH (ref 70–99)
Potassium: 3.6 mmol/L (ref 3.5–5.1)
Sodium: 139 mmol/L (ref 135–145)

## 2020-08-26 LAB — GLUCOSE, CAPILLARY
Glucose-Capillary: 120 mg/dL — ABNORMAL HIGH (ref 70–99)
Glucose-Capillary: 93 mg/dL (ref 70–99)
Glucose-Capillary: 124 mg/dL — ABNORMAL HIGH (ref 70–99)
Glucose-Capillary: 123 mg/dL — ABNORMAL HIGH (ref 70–99)
Glucose-Capillary: 85 mg/dL (ref 70–99)
Glucose-Capillary: 138 mg/dL — ABNORMAL HIGH (ref 70–99)

## 2020-08-26 LAB — PHOSPHORUS
Phosphorus: 3.7 mg/dL (ref 2.5–4.6)
Phosphorus: 3.6 mg/dL (ref 2.5–4.6)

## 2020-08-26 LAB — NASOPHARYNGEAL CULTURE: Culture: NORMAL

## 2020-08-26 LAB — MAGNESIUM
Magnesium: 1.9 mg/dL (ref 1.7–2.4)
Magnesium: 2.3 mg/dL (ref 1.7–2.4)

## 2020-08-26 MED ORDER — SODIUM CHLORIDE 0.9 % IV SOLN
2.0000 g | Freq: Three times a day (TID) | INTRAVENOUS | Status: AC
  Administered 2020-08-26 – 2020-09-02 (×20): 2 g via INTRAVENOUS
  Filled 2020-08-26 (×20): qty 2

## 2020-08-26 MED ORDER — METHOCARBAMOL 1000 MG/10ML IJ SOLN
500.0000 mg | Freq: Three times a day (TID) | INTRAVENOUS | Status: DC
  Administered 2020-08-26 – 2020-09-05 (×25): 500 mg via INTRAVENOUS
  Filled 2020-08-26: qty 5
  Filled 2020-08-26: qty 500
  Filled 2020-08-26: qty 5
  Filled 2020-08-26: qty 500
  Filled 2020-08-26 (×12): qty 5
  Filled 2020-08-26: qty 500
  Filled 2020-08-26 (×5): qty 5
  Filled 2020-08-26: qty 500
  Filled 2020-08-26 (×9): qty 5

## 2020-08-26 MED ORDER — LEVETIRACETAM IN NACL 500 MG/100ML IV SOLN
500.0000 mg | Freq: Two times a day (BID) | INTRAVENOUS | Status: DC
  Administered 2020-08-26 – 2020-09-03 (×16): 500 mg via INTRAVENOUS
  Filled 2020-08-26 (×16): qty 100

## 2020-08-26 MED ORDER — SUCCINYLCHOLINE CHLORIDE 20 MG/ML IJ SOLN
INTRAMUSCULAR | Status: DC | PRN
  Administered 2020-08-26: 120 mg via INTRAVENOUS

## 2020-08-26 MED ORDER — PROPOFOL 10 MG/ML IV BOLUS
INTRAVENOUS | Status: DC | PRN
  Administered 2020-08-26 (×2): 50 mg via INTRAVENOUS

## 2020-08-26 MED ORDER — DOCUSATE SODIUM 50 MG/5ML PO LIQD
100.0000 mg | Freq: Two times a day (BID) | ORAL | Status: DC
  Administered 2020-08-26: 100 mg
  Filled 2020-08-26: qty 10

## 2020-08-26 MED ORDER — METOPROLOL TARTRATE 25 MG/10 ML ORAL SUSPENSION
25.0000 mg | Freq: Two times a day (BID) | ORAL | Status: DC
  Administered 2020-08-26 – 2020-09-05 (×18): 25 mg
  Filled 2020-08-26 (×20): qty 10

## 2020-08-26 MED ORDER — BETHANECHOL CHLORIDE 25 MG PO TABS
25.0000 mg | ORAL_TABLET | Freq: Three times a day (TID) | ORAL | Status: DC
  Administered 2020-08-26 – 2020-09-05 (×28): 25 mg
  Filled 2020-08-26 (×6): qty 3
  Filled 2020-08-26: qty 1
  Filled 2020-08-26 (×6): qty 3
  Filled 2020-08-26: qty 1
  Filled 2020-08-26: qty 3
  Filled 2020-08-26: qty 1
  Filled 2020-08-26: qty 3
  Filled 2020-08-26: qty 1
  Filled 2020-08-26 (×6): qty 3
  Filled 2020-08-26: qty 1
  Filled 2020-08-26: qty 3
  Filled 2020-08-26: qty 1
  Filled 2020-08-26 (×2): qty 3
  Filled 2020-08-26: qty 1
  Filled 2020-08-26: qty 3

## 2020-08-26 MED ORDER — OXYCODONE HCL 5 MG/5ML PO SOLN
5.0000 mg | ORAL | Status: DC | PRN
  Administered 2020-08-26 – 2020-08-29 (×9): 10 mg
  Administered 2020-08-30: 5 mg
  Administered 2020-08-30 – 2020-09-01 (×7): 10 mg
  Administered 2020-09-01 – 2020-09-02 (×2): 5 mg
  Administered 2020-09-04 (×2): 10 mg
  Filled 2020-08-26 (×4): qty 10
  Filled 2020-08-26: qty 5
  Filled 2020-08-26 (×8): qty 10
  Filled 2020-08-26: qty 5
  Filled 2020-08-26 (×3): qty 10
  Filled 2020-08-26: qty 5
  Filled 2020-08-26 (×3): qty 10

## 2020-08-26 MED ORDER — OXYCODONE HCL 5 MG/5ML PO SOLN
5.0000 mg | ORAL | Status: DC | PRN
  Administered 2020-08-26: 5 mg
  Filled 2020-08-26: qty 5

## 2020-08-26 MED ORDER — PROPOFOL 1000 MG/100ML IV EMUL
0.0000 ug/kg/min | INTRAVENOUS | Status: DC
  Administered 2020-08-26 – 2020-08-27 (×4): 30 ug/kg/min via INTRAVENOUS
  Filled 2020-08-26 (×3): qty 100

## 2020-08-26 MED ORDER — ENOXAPARIN SODIUM 30 MG/0.3ML ~~LOC~~ SOLN
30.0000 mg | Freq: Two times a day (BID) | SUBCUTANEOUS | Status: DC
  Administered 2020-08-26 – 2020-09-05 (×20): 30 mg via SUBCUTANEOUS
  Filled 2020-08-26 (×21): qty 0.3

## 2020-08-26 MED ORDER — MAGNESIUM SULFATE IN D5W 1-5 GM/100ML-% IV SOLN
1.0000 g | Freq: Once | INTRAVENOUS | Status: AC
  Administered 2020-08-26: 1 g via INTRAVENOUS
  Filled 2020-08-26: qty 100

## 2020-08-26 MED ORDER — POTASSIUM CHLORIDE 20 MEQ PO PACK
40.0000 meq | PACK | Freq: Once | ORAL | Status: AC
  Administered 2020-08-26: 40 meq
  Filled 2020-08-26: qty 2
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
## 2020-08-26 NOTE — Progress Notes (Signed)
Subjective: Multiple events, no seizures reocrded on EEG  Exam: Vitals:   08/26/20 0815 08/26/20 1040  BP: (!) 161/79   Pulse: 100   Resp: 17   Temp: (!) 101.3 F (38.5 C) 100.3 F (37.9 C)  SpO2: 98%    Gen: In bed, NAD Resp: non-labored breathing, no acute distress Abd: soft, nt  Neuro: MS: opens eyes spontaneously,  does not follow commands. He does appear to fixate me once, but not reliably.  CN: Eyes with alternating gaze preference,  PERRL, corneals present bilaterally, cough/gag present.  Motor: flexion to noxious stimulation x 4.  Sensory: responds to nox stim x 4.   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 The movements captured are not consistent with seizure, but there was a witnessed event by the initial neurologist concerning for such. With no seizures x 48 hours, my concern continues to decrease. I would favor intermediate term AED therapy(e.g. 3-6 months), but may not need long term therapy.   For his TBI, would expect gradual improvement over time. With some signs of improved alertness already, and his young age, I would be hopeful for continued improvement but likely will take quite some time.   Recommendations: 1) continue keppra at 500mg  BID, would repeat EEG as outpatient once his MS has improved and consider D/C at that time.  2) can d/c EEG 3) Neurology will be available on an as needed basis. Please call with further questions or concerns.   , MD [facility] [phone]  If 7pm- 7am, please page neurology on call as listed in AMION. 08/26/2020  11:00 AM

## 2020-08-26 NOTE — Progress Notes (Signed)
ABG redrawn by RT. Results yielded were more accurate. 7.35, 27.7, 135, 15.4, 99%.
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
## 2020-08-26 NOTE — Progress Notes (Signed)
Audible cuff leak was noted even with multiple attempts at putting more air into the cuff. Expiratory volumes were around 125 as well. Dr.Thompson notified and anesthesia came to bedside for tube exchange. 7.5 ETT was placed with bougie and larynoscope without complication by the CRNA. Orange bite block with placed on ETT and it was secured at 26 at the lip.  Pt is stable at this time. RT will monitor.

## 2020-08-26 NOTE — Anesthesia Procedure Notes (Signed)
Procedure Name: Intubation Date/Time: 08/26/2020 3:24 PM Performed by: Marny Lowenstein, CRNA Pre-anesthesia Checklist: Patient identified, Emergency Drugs available, Suction available and Patient being monitored Patient Re-evaluated:Patient Re-evaluated prior to induction Oxygen Delivery Method: Ambu bag Preoxygenation: Pre-oxygenation with 100% oxygen Induction Type: IV induction Laryngoscope Size: Glidescope and 4 Grade View: Grade II Tube type: Oral Tube size: 7.5 mm Number of attempts: 1 Placement Confirmation: ETT inserted through vocal cords under direct vision,  CO2 detector and breath sounds checked- equal and bilateral Secured at: 26 cm Tube secured with: Tape Dental Injury: Teeth and Oropharynx as per pre-operative assessment
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
## 2020-08-26 NOTE — Progress Notes (Signed)
Discontinued LTM; no skin breakdown was seen. 

## 2020-08-26 NOTE — Progress Notes (Signed)
Morning ABG results were PH: 7.07, co2 16.5, po2 62, HCO3 4.8, so2 79%. Results were given to Elink.

## 2020-08-26 NOTE — Progress Notes (Signed)
Patient ID: Steve Mejia, male   DOB: 05/15/2001, 20 y.o.   MRN: [id number] Notified by RN of fever, thick secretions and urinary retnetion. He is also tachycardic with HTN. Plan sputum CX, empiric Maxipime, scheduled lopressor, I&O cath q6, and start urecholine. I also spoke with his family.  Violeta Gelinas, MD, MPH, FACS Please use [email or web address] to contact on call provider

## 2020-08-26 NOTE — Progress Notes (Signed)
@   0440: Received critical ABG results from Bing Neighbors, RTT.  @ [phone]: STAT labs drawn and sent to lab. @ 0506: Dr. Andrey Campanile paged regarding ABG results. @ 0523: Dr. Sheliah Hatch covering for Dr. Andrey Campanile per Amion. Dr. Sheliah Hatch paged regarding ABG results. @ 0542: STAT labs reviewed does not correlate with ABG results. Bing Neighbors, RRT called to recollect ABG. @ 0559: Repeat ABG obtained and results noted. ABG results congruent with previous ABG results.

## 2020-08-27 ENCOUNTER — Inpatient Hospital Stay (HOSPITAL_COMMUNITY): Payer: Medicaid Other

## 2020-08-27 LAB — POCT I-STAT 7, (LYTES, BLD GAS, ICA,H+H)
Acid-base deficit: 3 mmol/L — ABNORMAL HIGH (ref 0.0–2.0)
Bicarbonate: 22.3 mmol/L (ref 20.0–28.0)
Calcium, Ion: 1.2 mmol/L (ref 1.15–1.40)
HCT: 41 % (ref 39.0–52.0)
Hemoglobin: 13.9 g/dL (ref 13.0–17.0)
O2 Saturation: 98 %
Patient temperature: 99.8
Potassium: 4 mmol/L (ref 3.5–5.1)
Sodium: 139 mmol/L (ref 135–145)
TCO2: 23 mmol/L (ref 22–32)
pCO2 arterial: 39.6 mmHg (ref 32.0–48.0)
pH, Arterial: 7.362 (ref 7.350–7.450)
pO2, Arterial: 117 mmHg — ABNORMAL HIGH (ref 83.0–108.0)

## 2020-08-27 LAB — BASIC METABOLIC PANEL
Anion gap: 7 (ref 5–15)
BUN: 13 mg/dL (ref 6–20)
CO2: 23 mmol/L (ref 22–32)
Calcium: 8.5 mg/dL — ABNORMAL LOW (ref 8.9–10.3)
Chloride: 108 mmol/L (ref 98–111)
Creatinine, Ser: 1.01 mg/dL (ref 0.61–1.24)
GFR, Estimated: >60 mL/min (ref >60)
Glucose, Bld: 111 mg/dL — ABNORMAL HIGH (ref 70–99)
Potassium: 4 mmol/L (ref 3.5–5.1)
Sodium: 138 mmol/L (ref 135–145)

## 2020-08-27 LAB — CBC
HCT: 42.5 % (ref 39.0–52.0)
Hemoglobin: 13.9 g/dL (ref 13.0–17.0)
MCH: 28.2 pg (ref 26.0–34.0)
MCHC: 32.7 g/dL (ref 30.0–36.0)
MCV: 86.2 fL (ref 80.0–100.0)
Platelets: 161 10*3/uL (ref 150–400)
RBC: 4.93 MIL/uL (ref 4.22–5.81)
RDW: 12.7 % (ref 11.5–15.5)
WBC: 8.6 10*3/uL (ref 4.0–10.5)
nRBC: 0 % (ref 0.0–0.2)

## 2020-08-27 LAB — GLUCOSE, CAPILLARY
Glucose-Capillary: 112 mg/dL — ABNORMAL HIGH (ref 70–99)
Glucose-Capillary: 115 mg/dL — ABNORMAL HIGH (ref 70–99)
Glucose-Capillary: 116 mg/dL — ABNORMAL HIGH (ref 70–99)
Glucose-Capillary: 122 mg/dL — ABNORMAL HIGH (ref 70–99)
Glucose-Capillary: 124 mg/dL — ABNORMAL HIGH (ref 70–99)
Glucose-Capillary: 129 mg/dL — ABNORMAL HIGH (ref 70–99)
Glucose-Capillary: 134 mg/dL — ABNORMAL HIGH (ref 70–99)

## 2020-08-27 LAB — TRIGLYCERIDES: Triglycerides: 156 mg/dL — ABNORMAL HIGH (ref <150)

## 2020-08-27 MED ORDER — QUETIAPINE FUMARATE 100 MG PO TABS
100.0000 mg | ORAL_TABLET | Freq: Two times a day (BID) | ORAL | Status: DC
  Administered 2020-08-27 – 2020-08-28 (×5): 100 mg
  Filled 2020-08-27 (×5): qty 1

## 2020-08-27 MED ORDER — CLONAZEPAM 0.5 MG PO TABS
0.5000 mg | ORAL_TABLET | Freq: Two times a day (BID) | ORAL | Status: DC
  Administered 2020-08-27 – 2020-08-28 (×5): 0.5 mg
  Filled 2020-08-27 (×5): qty 1

## 2020-08-27 MED ORDER — DEXMEDETOMIDINE HCL IN NACL 400 MCG/100ML IV SOLN
0.4000 ug/kg/h | INTRAVENOUS | Status: DC
  Administered 2020-08-27: 0.6 ug/kg/h via INTRAVENOUS
  Administered 2020-08-27: 0.5 ug/kg/h via INTRAVENOUS
  Administered 2020-08-28: 1.2 ug/kg/h via INTRAVENOUS
  Administered 2020-08-28: 0.7 ug/kg/h via INTRAVENOUS
  Administered 2020-08-29 (×2): 1.2 ug/kg/h via INTRAVENOUS
  Administered 2020-08-29: 0.9 ug/kg/h via INTRAVENOUS
  Administered 2020-08-29: 1 ug/kg/h via INTRAVENOUS
  Administered 2020-08-30: 0.7 ug/kg/h via INTRAVENOUS
  Administered 2020-08-30: 1.2 ug/kg/h via INTRAVENOUS
  Administered 2020-08-30: 1 ug/kg/h via INTRAVENOUS
  Administered 2020-08-30: 0.7 ug/kg/h via INTRAVENOUS
  Administered 2020-08-31: 1.2 ug/kg/h via INTRAVENOUS
  Administered 2020-08-31: 0.8 ug/kg/h via INTRAVENOUS
  Administered 2020-08-31: 1 ug/kg/h via INTRAVENOUS
  Administered 2020-08-31: 1.003 ug/kg/h via INTRAVENOUS
  Administered 2020-09-01 (×2): 1.2 ug/kg/h via INTRAVENOUS
  Administered 2020-09-01: 0.6 ug/kg/h via INTRAVENOUS
  Filled 2020-08-27 (×17): qty 100

## 2020-08-27 NOTE — Progress Notes (Signed)
Pt transported from 2H06 to 4N27 with no complications
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
## 2020-08-27 NOTE — Progress Notes (Signed)
Patients mother Maia Plan called and made aware pt transferred to 4N 27. Mother given number to 4N nursing station.

## 2020-08-28 ENCOUNTER — Inpatient Hospital Stay (HOSPITAL_COMMUNITY): Payer: Medicaid Other

## 2020-08-28 LAB — TYPE AND SCREEN
ABO/RH(D): O POS
Antibody Screen: NEGATIVE
Unit division: 0
Unit division: 0
Unit division: 0
Unit division: 0
Unit division: 0
Unit division: 0
Unit division: 0
Unit division: 0

## 2020-08-28 LAB — BPAM RBC
Blood Product Expiration Date: 202205042359
Blood Product Expiration Date: 202205042359
Blood Product Expiration Date: 202205102359
Blood Product Expiration Date: 202205102359
Blood Product Expiration Date: 202205102359
Blood Product Expiration Date: 202205102359
Blood Product Expiration Date: 202205102359
Blood Product Expiration Date: 202205102359
ISSUE DATE / TIME: 202204090056
ISSUE DATE / TIME: 202204090106
ISSUE DATE / TIME: 202204090336
ISSUE DATE / TIME: 202204090336
ISSUE DATE / TIME: 202204130837
ISSUE DATE / TIME: 202204130837
Unit Type and Rh: 5100
Unit Type and Rh: 5100
Unit Type and Rh: 5100
Unit Type and Rh: 5100
Unit Type and Rh: 5100
Unit Type and Rh: 5100
Unit Type and Rh: 5100
Unit Type and Rh: 5100

## 2020-08-28 LAB — CBC
HCT: 35.3 % — ABNORMAL LOW (ref 39.0–52.0)
Hemoglobin: 11.6 g/dL — ABNORMAL LOW (ref 13.0–17.0)
MCH: 28.2 pg (ref 26.0–34.0)
MCHC: 32.9 g/dL (ref 30.0–36.0)
MCV: 85.9 fL (ref 80.0–100.0)
Platelets: 190 10*3/uL (ref 150–400)
RBC: 4.11 MIL/uL — ABNORMAL LOW (ref 4.22–5.81)
RDW: 12.6 % (ref 11.5–15.5)
WBC: 6.8 10*3/uL (ref 4.0–10.5)
nRBC: 0 % (ref 0.0–0.2)

## 2020-08-28 LAB — BASIC METABOLIC PANEL
Anion gap: 6 (ref 5–15)
BUN: 17 mg/dL (ref 6–20)
CO2: 25 mmol/L (ref 22–32)
Calcium: 8.8 mg/dL — ABNORMAL LOW (ref 8.9–10.3)
Chloride: 109 mmol/L (ref 98–111)
Creatinine, Ser: 0.86 mg/dL (ref 0.61–1.24)
GFR, Estimated: >60 mL/min (ref >60)
Glucose, Bld: 128 mg/dL — ABNORMAL HIGH (ref 70–99)
Potassium: 3.4 mmol/L — ABNORMAL LOW (ref 3.5–5.1)
Sodium: 140 mmol/L (ref 135–145)

## 2020-08-28 LAB — GLUCOSE, CAPILLARY
Glucose-Capillary: 125 mg/dL — ABNORMAL HIGH (ref 70–99)
Glucose-Capillary: 135 mg/dL — ABNORMAL HIGH (ref 70–99)
Glucose-Capillary: 125 mg/dL — ABNORMAL HIGH (ref 70–99)
Glucose-Capillary: 160 mg/dL — ABNORMAL HIGH (ref 70–99)
Glucose-Capillary: 136 mg/dL — ABNORMAL HIGH (ref 70–99)
Glucose-Capillary: 124 mg/dL — ABNORMAL HIGH (ref 70–99)

## 2020-08-28 LAB — CULTURE, RESPIRATORY W GRAM STAIN

## 2020-08-28 MED ORDER — PANTOPRAZOLE SODIUM 40 MG PO PACK: 40.0000 mg | PACK | Freq: Every day | ORAL | Status: DC

## 2020-08-28 MED ORDER — LOPERAMIDE HCL 1 MG/7.5ML PO SUSP
2.0000 mg | Freq: Four times a day (QID) | ORAL | Status: DC | PRN
  Filled 2020-08-28 (×2): qty 15

## 2020-08-28 MED ORDER — LORAZEPAM 2 MG/ML IJ SOLN
0.5000 mg | INTRAMUSCULAR | Status: DC | PRN
  Administered 2020-08-28: 1 mg via INTRAVENOUS
  Filled 2020-08-28: qty 1

## 2020-08-28 MED ORDER — POTASSIUM CHLORIDE 20 MEQ PO PACK
40.0000 meq | PACK | Freq: Once | ORAL | Status: AC
  Administered 2020-08-28: 40 meq
  Filled 2020-08-28: qty 2
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
## 2020-08-28 NOTE — Procedures (Signed)
Extubation Procedure Note  Patient Details:   Name: Steve Mejia DOB: 2000-10-03 MRN: [id number]   Airway Documentation:    Vent end date: 08/28/20 Vent end time: 1525   Evaluation  O2 sats: stable throughout Complications: Complications of Unable to provide cough post extubation. Patient did tolerate procedure well. Bilateral Breath Sounds: Clear,Diminished   Yes   Positive airway movement through vocal cords post extubation, pt is unable to initiate a cough. Mild obstruction heard in oropharyngeal area. Nasal trumpet placed to assist with mild obstruction. No stridor heard, positive cuff leak heard prior to extubation.   Dewain Penning T 08/28/2020, 3:37 PM

## 2020-08-28 NOTE — Progress Notes (Signed)
Patient ID: Steve Mejia, male   DOB: November 09, 2000, 20 y.o.   MRN: [id number] Tolerating extubation to [place] O2 well so far. Awake and follows some commands. Violeta Gelinas, MD, MPH, FACS Please use [email or web address] to contact on call provider

## 2020-08-28 NOTE — TOC Progression Note (Signed)
Transition of Care [facility]) - Progression Note    Patient Details  Name: MOZELL HARDACRE MRN: [id number] Date of Birth: [birth year]  Transition of Care [facility]) CM/SW Contact  Glennon Mac, RN Phone Number: 08/28/2020, 3:44 PM  Clinical Narrative:  Noted pt extubated this afternoon.  I spoke with pt's mother, [name], and she is encouraged by this.  Await TBI team evaluation.  Mom states pt was living with her and his step-mom prior to admission, and has lots of support at discharge. Will follow progress.       Expected Discharge Plan: IP Rehab Facility Barriers to Discharge: Continued Medical Work up  Expected Discharge Plan and Services Expected Discharge Plan: IP Rehab Facility   Discharge Planning Services: CM Consult   Living arrangements for the past 2 months: Single Family Home                                       Social Determinants of Health (SDOH) Interventions    Readmission Risk Interventions No flowsheet data found.  Quintella Baton, RN, BSN  Trauma/Neuro ICU Case Manager [phone]

## 2020-08-29 LAB — BASIC METABOLIC PANEL
Anion gap: 9 (ref 5–15)
BUN: 17 mg/dL (ref 6–20)
CO2: 24 mmol/L (ref 22–32)
Calcium: 9.2 mg/dL (ref 8.9–10.3)
Chloride: 105 mmol/L (ref 98–111)
Creatinine, Ser: 0.87 mg/dL (ref 0.61–1.24)
GFR, Estimated: >60 mL/min (ref >60)
Glucose, Bld: 126 mg/dL — ABNORMAL HIGH (ref 70–99)
Potassium: 3.9 mmol/L (ref 3.5–5.1)
Sodium: 138 mmol/L (ref 135–145)

## 2020-08-29 LAB — CBC
HCT: 35.9 % — ABNORMAL LOW (ref 39.0–52.0)
Hemoglobin: 12.1 g/dL — ABNORMAL LOW (ref 13.0–17.0)
MCH: 28.3 pg (ref 26.0–34.0)
MCHC: 33.7 g/dL (ref 30.0–36.0)
MCV: 84.1 fL (ref 80.0–100.0)
Platelets: 251 10*3/uL (ref 150–400)
RBC: 4.27 MIL/uL (ref 4.22–5.81)
RDW: 12.7 % (ref 11.5–15.5)
WBC: 8.1 10*3/uL (ref 4.0–10.5)
nRBC: 0 % (ref 0.0–0.2)

## 2020-08-29 LAB — GLUCOSE, CAPILLARY
Glucose-Capillary: 123 mg/dL — ABNORMAL HIGH (ref 70–99)
Glucose-Capillary: 134 mg/dL — ABNORMAL HIGH (ref 70–99)
Glucose-Capillary: 122 mg/dL — ABNORMAL HIGH (ref 70–99)
Glucose-Capillary: 135 mg/dL — ABNORMAL HIGH (ref 70–99)
Glucose-Capillary: 130 mg/dL — ABNORMAL HIGH (ref 70–99)

## 2020-08-29 MED ORDER — ORAL CARE MOUTH RINSE
15.0000 mL | Freq: Two times a day (BID) | OROMUCOSAL | Status: DC
  Administered 2020-08-30 – 2020-09-04 (×10): 15 mL via OROMUCOSAL

## 2020-08-29 MED ORDER — ACETAMINOPHEN 160 MG/5ML PO SOLN
650.0000 mg | Freq: Four times a day (QID) | ORAL | Status: DC | PRN
  Administered 2020-08-29: 650 mg
  Filled 2020-08-29 (×2): qty 20.3

## 2020-08-29 MED ORDER — ACETAMINOPHEN 160 MG/5ML PO SOLN
1000.0000 mg | Freq: Four times a day (QID) | ORAL | Status: DC
  Administered 2020-08-29 – 2020-09-05 (×28): 1000 mg
  Filled 2020-08-29 (×30): qty 40.6

## 2020-08-29 MED ORDER — QUETIAPINE FUMARATE 25 MG PO TABS
50.0000 mg | ORAL_TABLET | Freq: Two times a day (BID) | ORAL | Status: DC
  Administered 2020-08-29 – 2020-08-30 (×4): 50 mg
  Filled 2020-08-29 (×5): qty 2

## 2020-08-29 MED ORDER — LORAZEPAM 2 MG/ML IJ SOLN
0.5000 mg | INTRAMUSCULAR | Status: DC | PRN
  Administered 2020-08-29 – 2020-09-03 (×8): 0.5 mg via INTRAVENOUS
  Filled 2020-08-29 (×8): qty 1

## 2020-08-29 MED ORDER — PIVOT 1.5 CAL PO LIQD
1000.0000 mL | ORAL | Status: DC
  Administered 2020-08-29 – 2020-09-04 (×8): 1000 mL
  Filled 2020-08-29 (×7): qty 1000
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
## 2020-08-29 NOTE — Progress Notes (Signed)
Bilateral wrist restraints applied to facilitate tube feeding and medication administration via NG as pt tries to pull NG with green mittens.Discussed with Dr Janee Morn.

## 2020-08-29 NOTE — Procedures (Signed)
Cortrak  Person Inserting Tube:  Kendell Bane C, RD Tube Type:  Cortrak - 43 inches Tube Location:  Left nare Initial Placement:  Stomach Secured by: Bridle Technique Used to Measure Tube Placement:  Documented cm marking at nare/ corner of mouth Cortrak Secured At:  73 cm    Cortrak Tube Team Note:  Consult received to place a Cortrak feeding tube.   No x-ray is required. RN may begin using tube.   If the tube becomes dislodged please keep the tube and contact the Cortrak team at [email or web address] (password [email or web address]) for replacement.  If after hours and replacement cannot be delayed, place a NG tube and confirm placement with an abdominal x-ray.    Cammy Copa., RD, LDN, CNSC See AMiON for contact information
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
## 2020-08-30 DIAGNOSIS — E440 Moderate protein-calorie malnutrition: Secondary | ICD-10-CM | POA: Insufficient documentation

## 2020-08-30 LAB — CBC
HCT: 33 % — ABNORMAL LOW (ref 39.0–52.0)
Hemoglobin: 10.6 g/dL — ABNORMAL LOW (ref 13.0–17.0)
MCH: 28 pg (ref 26.0–34.0)
MCHC: 32.1 g/dL (ref 30.0–36.0)
MCV: 87.3 fL (ref 80.0–100.0)
Platelets: 298 10*3/uL (ref 150–400)
RBC: 3.78 MIL/uL — ABNORMAL LOW (ref 4.22–5.81)
RDW: 12.6 % (ref 11.5–15.5)
WBC: 8.1 10*3/uL (ref 4.0–10.5)
nRBC: 0 % (ref 0.0–0.2)

## 2020-08-30 LAB — BASIC METABOLIC PANEL
Anion gap: 8 (ref 5–15)
BUN: 18 mg/dL (ref 6–20)
CO2: 24 mmol/L (ref 22–32)
Calcium: 8.2 mg/dL — ABNORMAL LOW (ref 8.9–10.3)
Chloride: 101 mmol/L (ref 98–111)
Creatinine, Ser: 0.79 mg/dL (ref 0.61–1.24)
GFR, Estimated: >60 mL/min (ref >60)
Glucose, Bld: 316 mg/dL — ABNORMAL HIGH (ref 70–99)
Potassium: 3.7 mmol/L (ref 3.5–5.1)
Sodium: 133 mmol/L — ABNORMAL LOW (ref 135–145)

## 2020-08-30 LAB — GLUCOSE, CAPILLARY
Glucose-Capillary: 109 mg/dL — ABNORMAL HIGH (ref 70–99)
Glucose-Capillary: 113 mg/dL — ABNORMAL HIGH (ref 70–99)
Glucose-Capillary: 133 mg/dL — ABNORMAL HIGH (ref 70–99)
Glucose-Capillary: 121 mg/dL — ABNORMAL HIGH (ref 70–99)
Glucose-Capillary: 121 mg/dL — ABNORMAL HIGH (ref 70–99)
Glucose-Capillary: 131 mg/dL — ABNORMAL HIGH (ref 70–99)
Glucose-Capillary: 134 mg/dL — ABNORMAL HIGH (ref 70–99)

## 2020-08-30 NOTE — Progress Notes (Signed)
Inpatient Rehab Admissions Coordinator Note:   Per therapy recommendations, pt was screened for CIR candidacy by Megan Salon, MS CCC-SLP. At this time, Pt. Is total A with therapies and it is not clear that he could tolerate and participate in CIR level therapies at this time. I will hold off on placing CIR consult at this time, but [facility] team will follow for progress and participation and place CIR consult if Pt. Becomes appropriate.    Megan Salon, MS, CCC-SLP Rehab Admissions Coordinator  [phone] (celll) [phone] (office)
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
## 2020-08-31 LAB — CBC
HCT: 31.4 % — ABNORMAL LOW (ref 39.0–52.0)
Hemoglobin: 10.2 g/dL — ABNORMAL LOW (ref 13.0–17.0)
MCH: 28.4 pg (ref 26.0–34.0)
MCHC: 32.5 g/dL (ref 30.0–36.0)
MCV: 87.5 fL (ref 80.0–100.0)
Platelets: 381 10*3/uL (ref 150–400)
RBC: 3.59 MIL/uL — ABNORMAL LOW (ref 4.22–5.81)
RDW: 12.7 % (ref 11.5–15.5)
WBC: 5.6 10*3/uL (ref 4.0–10.5)
nRBC: 0 % (ref 0.0–0.2)

## 2020-08-31 LAB — BASIC METABOLIC PANEL
Anion gap: 8 (ref 5–15)
BUN: 19 mg/dL (ref 6–20)
CO2: 25 mmol/L (ref 22–32)
Calcium: 8.8 mg/dL — ABNORMAL LOW (ref 8.9–10.3)
Chloride: 108 mmol/L (ref 98–111)
Creatinine, Ser: 0.78 mg/dL (ref 0.61–1.24)
GFR, Estimated: >60 mL/min (ref >60)
Glucose, Bld: 118 mg/dL — ABNORMAL HIGH (ref 70–99)
Potassium: 3.8 mmol/L (ref 3.5–5.1)
Sodium: 141 mmol/L (ref 135–145)

## 2020-08-31 LAB — GLUCOSE, CAPILLARY
Glucose-Capillary: 135 mg/dL — ABNORMAL HIGH (ref 70–99)
Glucose-Capillary: 140 mg/dL — ABNORMAL HIGH (ref 70–99)
Glucose-Capillary: 133 mg/dL — ABNORMAL HIGH (ref 70–99)
Glucose-Capillary: 115 mg/dL — ABNORMAL HIGH (ref 70–99)
Glucose-Capillary: 116 mg/dL — ABNORMAL HIGH (ref 70–99)
Glucose-Capillary: 131 mg/dL — ABNORMAL HIGH (ref 70–99)

## 2020-08-31 MED ORDER — HALOPERIDOL LACTATE 5 MG/ML IJ SOLN
10.0000 mg | Freq: Four times a day (QID) | INTRAMUSCULAR | Status: DC | PRN
  Administered 2020-08-31 – 2020-09-05 (×3): 10 mg via INTRAVENOUS
  Filled 2020-08-31 (×3): qty 2

## 2020-08-31 MED ORDER — QUETIAPINE FUMARATE 50 MG PO TABS
75.0000 mg | ORAL_TABLET | Freq: Two times a day (BID) | ORAL | Status: DC
  Administered 2020-08-31 – 2020-09-04 (×9): 75 mg
  Filled 2020-08-31 (×3): qty 3
  Filled 2020-08-31 (×3): qty 2
  Filled 2020-08-31: qty 3
  Filled 2020-08-31: qty 2
  Filled 2020-08-31 (×2): qty 3

## 2020-08-31 NOTE — Progress Notes (Signed)
Pt suddenly woke up and trying to go out of bed and wanted to go to toilet explanation was made but pt was very agitated and impulsive, confused other nurses tried to comfort him and tried to calm him down but failed, medicated with ativan  As ordered
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
## 2020-08-31 NOTE — Progress Notes (Signed)
Spoke with DR Bedelia Person, referred pt was agitated, trying to go out of bed even with bilateral wrist restraint, already on precedex at 1.2 mcg/kg/hr, and was given ativan, Dr Bedelia Person telephone order of haldol 10 mg IV every 6 hrs PRN witnessed by RN Ladona Ridgel

## 2020-09-01 DIAGNOSIS — S062X3S Diffuse traumatic brain injury with loss of consciousness of 1 hour to 5 hours 59 minutes, sequela: Secondary | ICD-10-CM

## 2020-09-01 DIAGNOSIS — S26022S Major laceration of heart with hemopericardium, sequela: Secondary | ICD-10-CM

## 2020-09-01 LAB — CBC
HCT: 34.7 % — ABNORMAL LOW (ref 39.0–52.0)
Hemoglobin: 11.4 g/dL — ABNORMAL LOW (ref 13.0–17.0)
MCH: 28.2 pg (ref 26.0–34.0)
MCHC: 32.9 g/dL (ref 30.0–36.0)
MCV: 85.9 fL (ref 80.0–100.0)
Platelets: 516 10*3/uL — ABNORMAL HIGH (ref 150–400)
RBC: 4.04 MIL/uL — ABNORMAL LOW (ref 4.22–5.81)
RDW: 12.4 % (ref 11.5–15.5)
WBC: 9.2 10*3/uL (ref 4.0–10.5)
nRBC: 0 % (ref 0.0–0.2)

## 2020-09-01 LAB — GLUCOSE, CAPILLARY
Glucose-Capillary: 110 mg/dL — ABNORMAL HIGH (ref 70–99)
Glucose-Capillary: 121 mg/dL — ABNORMAL HIGH (ref 70–99)
Glucose-Capillary: 122 mg/dL — ABNORMAL HIGH (ref 70–99)
Glucose-Capillary: 128 mg/dL — ABNORMAL HIGH (ref 70–99)
Glucose-Capillary: 137 mg/dL — ABNORMAL HIGH (ref 70–99)
Glucose-Capillary: 97 mg/dL (ref 70–99)

## 2020-09-01 LAB — BASIC METABOLIC PANEL
Anion gap: 8 (ref 5–15)
BUN: 22 mg/dL — ABNORMAL HIGH (ref 6–20)
CO2: 27 mmol/L (ref 22–32)
Calcium: 9.2 mg/dL (ref 8.9–10.3)
Chloride: 104 mmol/L (ref 98–111)
Creatinine, Ser: 0.67 mg/dL (ref 0.61–1.24)
GFR, Estimated: >60 mL/min (ref >60)
Glucose, Bld: 117 mg/dL — ABNORMAL HIGH (ref 70–99)
Potassium: 3.9 mmol/L (ref 3.5–5.1)
Sodium: 139 mmol/L (ref 135–145)

## 2020-09-01 MED ORDER — SODIUM CHLORIDE 0.9 % IV SOLN
INTRAVENOUS | Status: DC | PRN
  Administered 2020-09-01: 500 mL via INTRAVENOUS

## 2020-09-01 MED ORDER — ENSURE ENLIVE PO LIQD
237.0000 mL | Freq: Three times a day (TID) | ORAL | Status: DC
  Administered 2020-09-01 – 2020-09-02 (×3): 237 mL via ORAL
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
## 2020-09-01 NOTE — Progress Notes (Signed)
Inpatient Rehab Admissions Coordinator:   At this time we are recommending a CIR consult and I will place an order per our protocol.   Estill Dooms, PT, DPT Admissions Coordinator [phone] 09/01/20  11:16 AM

## 2020-09-02 ENCOUNTER — Encounter (HOSPITAL_COMMUNITY): Payer: Self-pay

## 2020-09-02 LAB — GLUCOSE, CAPILLARY
Glucose-Capillary: 112 mg/dL — ABNORMAL HIGH (ref 70–99)
Glucose-Capillary: 117 mg/dL — ABNORMAL HIGH (ref 70–99)
Glucose-Capillary: 122 mg/dL — ABNORMAL HIGH (ref 70–99)
Glucose-Capillary: 144 mg/dL — ABNORMAL HIGH (ref 70–99)
Glucose-Capillary: 162 mg/dL — ABNORMAL HIGH (ref 70–99)
Glucose-Capillary: 99 mg/dL (ref 70–99)

## 2020-09-02 LAB — CBC
HCT: 39.2 % (ref 39.0–52.0)
Hemoglobin: 13.1 g/dL (ref 13.0–17.0)
MCH: 28 pg (ref 26.0–34.0)
MCHC: 33.4 g/dL (ref 30.0–36.0)
MCV: 83.8 fL (ref 80.0–100.0)
Platelets: 680 10*3/uL — ABNORMAL HIGH (ref 150–400)
RBC: 4.68 MIL/uL (ref 4.22–5.81)
RDW: 12.5 % (ref 11.5–15.5)
WBC: 11.1 10*3/uL — ABNORMAL HIGH (ref 4.0–10.5)
nRBC: 0 % (ref 0.0–0.2)

## 2020-09-02 LAB — BASIC METABOLIC PANEL
Anion gap: 10 (ref 5–15)
BUN: 19 mg/dL (ref 6–20)
CO2: 27 mmol/L (ref 22–32)
Calcium: 9.7 mg/dL (ref 8.9–10.3)
Chloride: 100 mmol/L (ref 98–111)
Creatinine, Ser: 0.66 mg/dL (ref 0.61–1.24)
GFR, Estimated: >60 mL/min (ref >60)
Glucose, Bld: 124 mg/dL — ABNORMAL HIGH (ref 70–99)
Potassium: 3.7 mmol/L (ref 3.5–5.1)
Sodium: 137 mmol/L (ref 135–145)

## 2020-09-02 MED ORDER — ENSURE ENLIVE PO LIQD: 237.0000 mL | ORAL | Status: DC

## 2020-09-02 NOTE — Progress Notes (Signed)
Nutrition Brief Note  Spoke with pt and RN. Per RN pt has been refusing PO's today.   Will continue Hormel shake TID with meals and magic cup TID with meals. Decrease ensure to daily.  Continue to encourage PO intake but will continue supplemental feedings via Cortrak tube.  Pivot 1.5 @ 70 ml/hr  Noted plans for CIR.   Cammy Copa., RD, LDN, CNSC See AMiON for contact information
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
## 2020-09-02 NOTE — Progress Notes (Signed)
Inpatient Rehabilitation Admissions Coordinator  I met with patient as he worked with with PT and OT. He is an excellent candidate for CIR admit. I contacted his Mom by phone to discuss rehab goals and expectations. She is in agreement to plan. I await bed availability to admit him. No bed is available today.  Danne Baxter, RN, MSN Rehab Admissions Coordinator [phone] 09/02/2020 12:17 PM
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
## 2020-09-02 NOTE — TOC Progression Note (Signed)
Transition of Care [facility]) - Progression Note    Patient Details  Name: Steve Mejia MRN: [id number] Date of Birth: 30-Dec-2000  Transition of Care [facility]) CM/SW Contact  Glennon Mac, RN Phone Number: 09/02/2020, 3:16 PM  Clinical Narrative:  CIR following for potential rehab admission when bed available.  Will follow progress.      Expected Discharge Plan: IP Rehab Facility Barriers to Discharge: Continued Medical Work up  Expected Discharge Plan and Services Expected Discharge Plan: IP Rehab Facility   Discharge Planning Services: CM Consult   Living arrangements for the past 2 months: Single Family Home                                       Social Determinants of Health (SDOH) Interventions    Readmission Risk Interventions No flowsheet data found.  Quintella Baton, RN, BSN  Trauma/Neuro ICU Case Manager [phone]

## 2020-09-03 ENCOUNTER — Inpatient Hospital Stay (HOSPITAL_COMMUNITY): Payer: Medicaid Other

## 2020-09-03 LAB — CBC
HCT: 42.5 % (ref 39.0–52.0)
Hemoglobin: 13.9 g/dL (ref 13.0–17.0)
MCH: 28 pg (ref 26.0–34.0)
MCHC: 32.7 g/dL (ref 30.0–36.0)
MCV: 85.5 fL (ref 80.0–100.0)
Platelets: 818 10*3/uL — ABNORMAL HIGH (ref 150–400)
RBC: 4.97 MIL/uL (ref 4.22–5.81)
RDW: 13.1 % (ref 11.5–15.5)
WBC: 12.8 10*3/uL — ABNORMAL HIGH (ref 4.0–10.5)
nRBC: 0 % (ref 0.0–0.2)

## 2020-09-03 LAB — BASIC METABOLIC PANEL
Anion gap: 11 (ref 5–15)
BUN: 25 mg/dL — ABNORMAL HIGH (ref 6–20)
CO2: 26 mmol/L (ref 22–32)
Calcium: 10.2 mg/dL (ref 8.9–10.3)
Chloride: 102 mmol/L (ref 98–111)
Creatinine, Ser: 0.66 mg/dL (ref 0.61–1.24)
GFR, Estimated: >60 mL/min (ref >60)
Glucose, Bld: 104 mg/dL — ABNORMAL HIGH (ref 70–99)
Potassium: 4.5 mmol/L (ref 3.5–5.1)
Sodium: 139 mmol/L (ref 135–145)

## 2020-09-03 LAB — GLUCOSE, CAPILLARY
Glucose-Capillary: 125 mg/dL — ABNORMAL HIGH (ref 70–99)
Glucose-Capillary: 121 mg/dL — ABNORMAL HIGH (ref 70–99)
Glucose-Capillary: 105 mg/dL — ABNORMAL HIGH (ref 70–99)
Glucose-Capillary: 169 mg/dL — ABNORMAL HIGH (ref 70–99)
Glucose-Capillary: 128 mg/dL — ABNORMAL HIGH (ref 70–99)
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
## 2020-09-03 NOTE — Procedures (Signed)
Cortrak  Person Inserting Tube:  Sylva Overley C, RD Tube Type:  Cortrak - 43 inches Tube Location:  Left nare Initial Placement:  Stomach Secured by: Bridle Technique Used to Measure Tube Placement:  Documented cm marking at nare/ corner of mouth Cortrak Secured At:  72 cm    Cortrak Tube Team Note:  Consult received to place a Cortrak feeding tube.   No x-ray is required. RN may begin using tube.   If the tube becomes dislodged please keep the tube and contact the Cortrak team at [email or web address] (password [email or web address]) for replacement.  If after hours and replacement cannot be delayed, place a NG tube and confirm placement with an abdominal x-ray.    Etienne Mowers P., RD, LDN, CNSC See AMiON for contact information    

## 2020-09-03 NOTE — Progress Notes (Signed)
Pt noted sitting in bed, both mitts off and in the floor, and DHT now out.  Bridle remains in place and DHT is simply hanging from nose by the bridle, both tips exposed.  TF stopped.  When asked why he did this, pt has no answer at all.  Secure message sent to Dr. Leonard Schwartz. Janee Morn for guidance.  Per his request, will leave DHT out for now and direct all other trauma inquiries to Dr. Bedelia Person

## 2020-09-04 LAB — GLUCOSE, CAPILLARY
Glucose-Capillary: 111 mg/dL — ABNORMAL HIGH (ref 70–99)
Glucose-Capillary: 111 mg/dL — ABNORMAL HIGH (ref 70–99)
Glucose-Capillary: 118 mg/dL — ABNORMAL HIGH (ref 70–99)
Glucose-Capillary: 123 mg/dL — ABNORMAL HIGH (ref 70–99)
Glucose-Capillary: 128 mg/dL — ABNORMAL HIGH (ref 70–99)
Glucose-Capillary: 130 mg/dL — ABNORMAL HIGH (ref 70–99)
Glucose-Capillary: 144 mg/dL — ABNORMAL HIGH (ref 70–99)

## 2020-09-04 MED ORDER — QUETIAPINE FUMARATE 50 MG PO TABS
50.0000 mg | ORAL_TABLET | Freq: Two times a day (BID) | ORAL | Status: DC
  Administered 2020-09-04 – 2020-09-05 (×2): 50 mg
  Filled 2020-09-04 (×2): qty 1

## 2020-09-04 MED ORDER — LIP MEDEX EX OINT
TOPICAL_OINTMENT | CUTANEOUS | Status: DC | PRN
  Filled 2020-09-04: qty 7

## 2020-09-04 NOTE — Progress Notes (Signed)
Inpatient Rehabilitation Admissions Coordinator  I met with patient's step Mom at bedside. We discussed goals and expectations of a CIR admit. I await bed availability and am hopeful for admit Friday. She is in agreement. I will follow up tomorrow.  Danne Baxter, RN, MSN Rehab Admissions Coordinator [phone] 09/04/2020 11:54 AM
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
## 2020-09-04 NOTE — Progress Notes (Signed)
Patient tried to eat jello and sherbert and had nausea and vomiting after he ate. Gave him Zofran. When he started feeling better he said his was was 7 out of 10 so I gave him oxy. Pain was located in his mouth
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
## 2020-09-04 NOTE — Progress Notes (Signed)
SLP Cancellation Note  Patient Details Name: Steve Mejia MRN: [id number] DOB: 2001-03-13   Cancelled treatment:    Pt irritable and declining participation today.  Will try again as schedule allows. Jodene Polyak L. Samson Frederic, MA CCC/SLP Acute Rehabilitation Services Office number [phone] Pager [phone]       Steve Mejia 09/04/2020, 1:56 PM

## 2020-09-05 ENCOUNTER — Other Ambulatory Visit: Payer: Self-pay

## 2020-09-05 ENCOUNTER — Inpatient Hospital Stay (HOSPITAL_COMMUNITY)
Admission: RE | Admit: 2020-09-05 | Discharge: 2020-10-01 | DRG: 945 | Disposition: A | Discharge status: Home / Self Care | Payer: Medicaid Other | Source: Intra-hospital | Attending: Physical Medicine & Rehabilitation | Admitting: Physical Medicine & Rehabilitation

## 2020-09-05 ENCOUNTER — Inpatient Hospital Stay (HOSPITAL_COMMUNITY)
Admission: RE | Admit: 2020-09-05 | Payer: No Typology Code available for payment source | Source: Intra-hospital | Admitting: Physical Medicine & Rehabilitation

## 2020-09-05 ENCOUNTER — Encounter (HOSPITAL_COMMUNITY): Payer: Self-pay | Admitting: Physical Medicine & Rehabilitation

## 2020-09-05 DIAGNOSIS — R000 Tachycardia, unspecified: Secondary | ICD-10-CM | POA: Diagnosis not present

## 2020-09-05 DIAGNOSIS — G40909 Epilepsy, unspecified, not intractable, without status epilepticus: Secondary | ICD-10-CM

## 2020-09-05 DIAGNOSIS — S069X9A Unspecified intracranial injury with loss of consciousness of unspecified duration, initial encounter: Secondary | ICD-10-CM | POA: Diagnosis present

## 2020-09-05 DIAGNOSIS — S0266XD Fracture of symphysis of mandible, subsequent encounter for fracture with routine healing: Secondary | ICD-10-CM | POA: Diagnosis not present

## 2020-09-05 DIAGNOSIS — F909 Attention-deficit hyperactivity disorder, unspecified type: Secondary | ICD-10-CM | POA: Diagnosis present

## 2020-09-05 DIAGNOSIS — Z681 Body mass index (BMI) 19 or less, adult: Secondary | ICD-10-CM | POA: Diagnosis not present

## 2020-09-05 DIAGNOSIS — Z298 Encounter for other specified prophylactic measures: Secondary | ICD-10-CM

## 2020-09-05 DIAGNOSIS — Z91030 Bee allergy status: Secondary | ICD-10-CM | POA: Diagnosis not present

## 2020-09-05 DIAGNOSIS — E440 Moderate protein-calorie malnutrition: Secondary | ICD-10-CM | POA: Diagnosis present

## 2020-09-05 DIAGNOSIS — S069X9D Unspecified intracranial injury with loss of consciousness of unspecified duration, subsequent encounter: Principal | ICD-10-CM

## 2020-09-05 DIAGNOSIS — Z560 Unemployment, unspecified: Secondary | ICD-10-CM

## 2020-09-05 DIAGNOSIS — S069XAA Unspecified intracranial injury with loss of consciousness status unknown, initial encounter: Secondary | ICD-10-CM | POA: Diagnosis present

## 2020-09-05 DIAGNOSIS — S2601XS Contusion of heart with hemopericardium, sequela: Secondary | ICD-10-CM

## 2020-09-05 DIAGNOSIS — R1310 Dysphagia, unspecified: Secondary | ICD-10-CM | POA: Diagnosis present

## 2020-09-05 DIAGNOSIS — R4189 Other symptoms and signs involving cognitive functions and awareness: Secondary | ICD-10-CM | POA: Diagnosis present

## 2020-09-05 DIAGNOSIS — D62 Acute posthemorrhagic anemia: Secondary | ICD-10-CM | POA: Diagnosis present

## 2020-09-05 DIAGNOSIS — R451 Restlessness and agitation: Secondary | ICD-10-CM | POA: Diagnosis not present

## 2020-09-05 DIAGNOSIS — Z781 Physical restraint status: Secondary | ICD-10-CM

## 2020-09-05 DIAGNOSIS — R269 Unspecified abnormalities of gait and mobility: Secondary | ICD-10-CM | POA: Diagnosis present

## 2020-09-05 DIAGNOSIS — S0990XD Unspecified injury of head, subsequent encounter: Secondary | ICD-10-CM

## 2020-09-05 DIAGNOSIS — S2600XD Unspecified injury of heart with hemopericardium, subsequent encounter: Secondary | ICD-10-CM

## 2020-09-05 DIAGNOSIS — D75838 Other thrombocytosis: Secondary | ICD-10-CM | POA: Diagnosis present

## 2020-09-05 DIAGNOSIS — R569 Unspecified convulsions: Secondary | ICD-10-CM | POA: Diagnosis present

## 2020-09-05 DIAGNOSIS — S069X3S Unspecified intracranial injury with loss of consciousness of 1 hour to 5 hours 59 minutes, sequela: Secondary | ICD-10-CM

## 2020-09-05 DIAGNOSIS — S0291XD Unspecified fracture of skull, subsequent encounter for fracture with routine healing: Secondary | ICD-10-CM

## 2020-09-05 DIAGNOSIS — S02609A Fracture of mandible, unspecified, initial encounter for closed fracture: Secondary | ICD-10-CM | POA: Diagnosis present

## 2020-09-05 DIAGNOSIS — S069X0S Unspecified intracranial injury without loss of consciousness, sequela: Secondary | ICD-10-CM

## 2020-09-05 DIAGNOSIS — R4689 Other symptoms and signs involving appearance and behavior: Secondary | ICD-10-CM

## 2020-09-05 LAB — GLUCOSE, CAPILLARY
Glucose-Capillary: 107 mg/dL — ABNORMAL HIGH (ref 70–99)
Glucose-Capillary: 114 mg/dL — ABNORMAL HIGH (ref 70–99)
Glucose-Capillary: 122 mg/dL — ABNORMAL HIGH (ref 70–99)

## 2020-09-05 MED ORDER — LEVETIRACETAM 100 MG/ML PO SOLN
500.0000 mg | Freq: Two times a day (BID) | ORAL | Status: DC
  Administered 2020-09-05 – 2020-10-01 (×52): 500 mg via ORAL
  Filled 2020-09-05 (×53): qty 5

## 2020-09-05 MED ORDER — GUAIFENESIN-DM 100-10 MG/5ML PO SYRP: 5.0000 mL | ORAL_SOLUTION | Freq: Four times a day (QID) | ORAL | Status: DC | PRN

## 2020-09-05 MED ORDER — POLYETHYLENE GLYCOL 3350 17 G PO PACK
17.0000 g | PACK | Freq: Every day | ORAL | Status: DC | PRN
  Administered 2020-09-25: 17 g via ORAL
  Filled 2020-09-05: qty 1

## 2020-09-05 MED ORDER — TRAZODONE HCL 50 MG PO TABS
25.0000 mg | ORAL_TABLET | Freq: Every evening | ORAL | Status: DC | PRN
  Administered 2020-09-07: 50 mg via ORAL
  Filled 2020-09-05: qty 1

## 2020-09-05 MED ORDER — METHOCARBAMOL 1000 MG/10ML IJ SOLN: 500.0000 mg | Freq: Three times a day (TID) | INTRAVENOUS | Status: DC

## 2020-09-05 MED ORDER — PROCHLORPERAZINE 25 MG RE SUPP: 12.5000 mg | Freq: Four times a day (QID) | RECTAL | Status: DC | PRN

## 2020-09-05 MED ORDER — BETHANECHOL CHLORIDE 25 MG PO TABS
25.0000 mg | ORAL_TABLET | Freq: Three times a day (TID) | ORAL | Status: DC
  Administered 2020-09-05 – 2020-09-06 (×5): 25 mg
  Filled 2020-09-05 (×6): qty 1

## 2020-09-05 MED ORDER — ACETAMINOPHEN 160 MG/5ML PO SOLN
650.0000 mg | Freq: Four times a day (QID) | ORAL | Status: DC
  Administered 2020-09-05 – 2020-09-10 (×17): 650 mg
  Filled 2020-09-05 (×18): qty 20.3

## 2020-09-05 MED ORDER — PROCHLORPERAZINE MALEATE 5 MG PO TABS: 5.0000 mg | ORAL_TABLET | Freq: Four times a day (QID) | ORAL | Status: DC | PRN

## 2020-09-05 MED ORDER — ACETAMINOPHEN 325 MG PO TABS: 325.0000 mg | ORAL_TABLET | ORAL | Status: DC | PRN

## 2020-09-05 MED ORDER — ALUM & MAG HYDROXIDE-SIMETH 200-200-20 MG/5ML PO SUSP
30.0000 mL | ORAL | Status: DC | PRN
  Administered 2020-09-09 – 2020-09-18 (×5): 30 mL via ORAL
  Filled 2020-09-05 (×5): qty 30

## 2020-09-05 MED ORDER — METHOCARBAMOL 500 MG PO TABS
500.0000 mg | ORAL_TABLET | Freq: Three times a day (TID) | ORAL | Status: DC
  Administered 2020-09-05 – 2020-09-07 (×6): 500 mg
  Filled 2020-09-05 (×6): qty 1

## 2020-09-05 MED ORDER — LIP MEDEX EX OINT
TOPICAL_OINTMENT | CUTANEOUS | Status: DC | PRN
  Filled 2020-09-05 (×2): qty 7

## 2020-09-05 MED ORDER — PROCHLORPERAZINE EDISYLATE 10 MG/2ML IJ SOLN: 5.0000 mg | Freq: Four times a day (QID) | INTRAMUSCULAR | Status: DC | PRN

## 2020-09-05 MED ORDER — ENSURE ENLIVE PO LIQD
237.0000 mL | ORAL | Status: DC
  Administered 2020-09-05 – 2020-09-08 (×4): 237 mL via ORAL

## 2020-09-05 MED ORDER — ENOXAPARIN SODIUM 30 MG/0.3ML ~~LOC~~ SOLN: 30.0000 mg | Freq: Two times a day (BID) | SUBCUTANEOUS | Status: DC

## 2020-09-05 MED ORDER — PIVOT 1.5 CAL PO LIQD
1000.0000 mL | ORAL | Status: DC
  Administered 2020-09-05 – 2020-09-07 (×3): 1000 mL
  Filled 2020-09-05 (×7): qty 1000

## 2020-09-05 MED ORDER — ENOXAPARIN SODIUM 30 MG/0.3ML ~~LOC~~ SOLN
30.0000 mg | Freq: Two times a day (BID) | SUBCUTANEOUS | Status: DC
  Administered 2020-09-05 – 2020-10-01 (×51): 30 mg via SUBCUTANEOUS
  Filled 2020-09-05 (×52): qty 0.3

## 2020-09-05 MED ORDER — BISACODYL 10 MG RE SUPP: 10.0000 mg | Freq: Every day | RECTAL | Status: DC | PRN

## 2020-09-05 MED ORDER — METOPROLOL TARTRATE 25 MG/10 ML ORAL SUSPENSION
25.0000 mg | Freq: Two times a day (BID) | ORAL | Status: DC
  Administered 2020-09-05 – 2020-09-10 (×10): 25 mg
  Filled 2020-09-05 (×11): qty 10

## 2020-09-05 MED ORDER — QUETIAPINE FUMARATE 50 MG PO TABS
50.0000 mg | ORAL_TABLET | Freq: Two times a day (BID) | ORAL | Status: DC
  Administered 2020-09-05 – 2020-09-06 (×3): 50 mg
  Filled 2020-09-05 (×4): qty 1

## 2020-09-05 MED ORDER — DIPHENHYDRAMINE HCL 12.5 MG/5ML PO ELIX
12.5000 mg | ORAL_SOLUTION | Freq: Four times a day (QID) | ORAL | Status: DC | PRN
  Administered 2020-09-06: 25 mg via ORAL
  Filled 2020-09-05: qty 10

## 2020-09-05 MED ORDER — OXYCODONE HCL 5 MG/5ML PO SOLN
5.0000 mg | ORAL | Status: DC | PRN
  Administered 2020-09-07 – 2020-09-10 (×7): 10 mg
  Filled 2020-09-05 (×7): qty 10

## 2020-09-05 MED ORDER — FLEET ENEMA 7-19 GM/118ML RE ENEM: 1.0000 | ENEMA | Freq: Once | RECTAL | Status: DC | PRN

## 2020-09-05 NOTE — TOC Transition Note (Signed)
Transition of Care [facility]) - CM/SW Discharge Note   Patient Details  Name: [name] MRN: [id number] Date of Birth: January 11, 2001  Transition of Care [facility]) CM/SW Contact:  Glennon Mac, RN Phone Number: 09/05/2020, 12:09 PM   Clinical Narrative:   Pt medically stable for discharge and patient has been accepted for admission to [facility] IP Rehab today.  Plan dc to CIR upon bed availability.     Final next level of care: IP Rehab Facility Barriers to Discharge: Barriers Resolved   Patient Goals and CMS Choice   CMS Medicare.gov Compare Post Acute Care list provided to:: Patient Represenative (must comment) (mother) Choice offered to / list presented to : Parent                      Discharge Plan and Services   Discharge Planning Services: CM Consult                                 Social Determinants of Health (SDOH) Interventions     Readmission Risk Interventions No flowsheet data found.  Quintella Baton, RN, BSN  Trauma/Neuro ICU Case Manager [phone]
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
## 2020-09-05 NOTE — Progress Notes (Signed)
Patient ID: Steve Mejia, male   DOB: 11/02/2000, 20 y.o.   MRN: [id number]  Met with patient, brother, and LPN in the room to perform skin assessment. Patient was resting with bilateral soft-wrist restraints in place. Patient rolled with assistance in able to view skin. Skin was clean, dry, and intact. Explained to brother that rehab will begin tomorrow morning. Explained my role in the patient's care and my hours of availability. Put in order for wrist restraints for MD to sign. Will continue to monitor patient's progress.   , RN3, BSN, CBIS, CRRN, WTA Care Coordinator, Inpatient Rehabilitation Office [phone] Cell [phone] 
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
## 2020-09-05 NOTE — Progress Notes (Signed)
TCTS BRIEF PROGRESS NOTE  12 Days Post-Op  S/P Procedure(s) (LRB): OPEN REDUCTION INTERNAL FIXATION (ORIF) MANDIBULAR FRACTURE AND REPAIR FACIAL LACERATIONS; EXTRACTION OF TEETH #25 AND #26 WITH BONE GRAFT ANTERIOR MANDIBLE. (N/A)   Patient has made remarkable progress under the excellent care of the entire trauma team Plans for d/c to CIR service noted Sternotomy is healing well Patient does not need any further follow-up in our office but please do not hesitate to call if questions arise  Purcell Nails, MD 09/05/2020 10:32 AM
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
## 2020-09-05 NOTE — Progress Notes (Signed)
Inpatient Rehabilitation Medication Review by a Pharmacist  A complete drug regimen review was completed for this patient to identify any potential clinically significant medication issues.  Clinically significant medication issues were identified:  yes   Type of Medication Issue Identified Description of Issue Urgent (address now) Non-Urgent (address on AM team rounds) Plan Plan Accepted by Provider? (Yes / No / Pending AM Rounds)  Drug Interaction(s) (clinically significant)       Duplicate Therapy       Allergy       No Medication Administration End Date       Incorrect Dose       Additional Drug Therapy Needed  Medline mouth rinse ordered inpatient BID, not continued in rehab despite patient still being NPO and having tube Non urgent CIR rounds ask it can be added back by following pharmacist  Pending   Other         Name of provider notified for urgent issues identified:   Provider Method of Notification:    For non-urgent medication issues to be resolved on team rounds tomorrow morning a [facility] Secure Chat Handoff was sent to:    Pharmacist comments:   Time spent performing this drug regimen review (minutes):  15   Briasia Flinders 09/05/2020 5:40 PM
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
## 2020-09-06 DIAGNOSIS — R1311 Dysphagia, oral phase: Secondary | ICD-10-CM

## 2020-09-06 LAB — GLUCOSE, CAPILLARY: Glucose-Capillary: 117 mg/dL — ABNORMAL HIGH (ref 70–99)

## 2020-09-06 NOTE — Significant Event (Signed)
Rapid Response Event Note   Reason for Call :  Difficult to arouse  Initial Focused Assessment:  RN called me to assess this patient.  Per rehab staff he was very hard to arouse during therapy.  Patient was asleep when I got to the room.  VS WNL and CBG 117.  Patient was following some commands, thumbs up and eye opening.  He would dose back off to sleep after that.  When painful stimuli applied the patient screamed "fuck you bitch" and "leave me the fuck alone" then started crying.  Patient was able to move all extremities quite well.  He does not appear to have any focal deficits or LVO symptoms.  The patient does not appear to be in any distress.    BP 112/67 ECG 87 O2 99 room air RR 17 CBG 117    Interventions:  Vitals taken and patients neuro status assessed  Plan of Care:  Patient will remain on unit.      Event Summary:   MD Notified:  Call Time: 1115 Arrival Time: 1116 End Time: 1149  Andrey Spearman, RN
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
## 2020-09-06 NOTE — Progress Notes (Signed)
Patient reported feeling like something was crawling on him and started to try to scratch at skin. Patient was administered PRN benadryl as ordered for itching. Cletis Media, LPN
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
## 2020-09-06 NOTE — Progress Notes (Signed)
Therapy reported that patient was unable to arouse. Assessed pt. Able to open eye for brief seconds, unresponsive to sternal rubs. Rapid response called to room to assess pt. Pt responds to pain and verbal commands after several attempts. Pt then yelled and asked staff to leave him alone. Pt appears in no distress. Event appears to be behavior related. Repositioned pt to comfort and no further intervention needed per RR.   Marylu Lund, RN
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
## 2020-09-06 NOTE — Plan of Care (Signed)
  Problem: RH Balance Goal: LTG Patient will maintain dynamic standing with ADLs (OT) Description: LTG:  Patient will maintain dynamic standing balance with assist during activities of daily living (OT)  Flowsheets (Taken 09/06/2020 1535) LTG: Pt will maintain dynamic standing balance during ADLs with: Supervision/Verbal cueing   Problem: Sit to Stand Goal: LTG:  Patient will perform sit to stand in prep for activites of daily living with assistance level (OT) Description: LTG:  Patient will perform sit to stand in prep for activites of daily living with assistance level (OT) Flowsheets (Taken 09/06/2020 1535) LTG: PT will perform sit to stand in prep for activites of daily living with assistance level: Supervision/Verbal cueing   Problem: RH Grooming Goal: LTG Patient will perform grooming w/assist,cues/equip (OT) Description: LTG: Patient will perform grooming with assist, with/without cues using equipment (OT) Flowsheets (Taken 09/06/2020 1535) LTG: Pt will perform grooming with assistance level of: Supervision/Verbal cueing   Problem: RH Bathing Goal: LTG Patient will bathe all body parts with assist levels (OT) Description: LTG: Patient will bathe all body parts with assist levels (OT) Flowsheets (Taken 09/06/2020 1535) LTG: Pt will perform bathing with assistance level/cueing: Supervision/Verbal cueing   Problem: RH Dressing Goal: LTG Patient will perform upper body dressing (OT) Description: LTG Patient will perform upper body dressing with assist, with/without cues (OT). Flowsheets (Taken 09/06/2020 1535) LTG: Pt will perform upper body dressing with assistance level of: Supervision/Verbal cueing Goal: LTG Patient will perform lower body dressing w/assist (OT) Description: LTG: Patient will perform lower body dressing with assist, with/without cues in positioning using equipment (OT) Flowsheets (Taken 09/06/2020 1535) LTG: Pt will perform lower body dressing with assistance level  of: Supervision/Verbal cueing   Problem: RH Toilet Transfers Goal: LTG Patient will perform toilet transfers w/assist (OT) Description: LTG: Patient will perform toilet transfers with assist, with/without cues using equipment (OT) Flowsheets (Taken 09/06/2020 1535) LTG: Pt will perform toilet transfers with assistance level of: Supervision/Verbal cueing   Problem: RH Tub/Shower Transfers Goal: LTG Patient will perform tub/shower transfers w/assist (OT) Description: LTG: Patient will perform tub/shower transfers with assist, with/without cues using equipment (OT) Flowsheets (Taken 09/06/2020 1535) LTG: Pt will perform tub/shower stall transfers with assistance level of: Contact Guard/Touching assist

## 2020-09-06 NOTE — Plan of Care (Signed)
  Problem: Safety: Goal: Non-violent Restraint(s) Outcome: Progressing   Problem: Consults Goal: Skin Care Protocol Initiated - if Braden Score 18 or less Description: If consults are not indicated, leave blank or document N/A Outcome: Progressing   Problem: RH BOWEL ELIMINATION Goal: RH STG MANAGE BOWEL W/MEDICATION W/ASSISTANCE Description: STG Manage Bowel with Medication with Supervision Assistance. Outcome: Progressing   Problem: RH BLADDER ELIMINATION Goal: RH STG MANAGE BLADDER WITH MEDICATION WITH ASSISTANCE Description: STG Manage Bladder With Medication With Supervision Assistance. Outcome: Progressing   Problem: RH SKIN INTEGRITY Goal: RH STG ABLE TO PERFORM INCISION/WOUND CARE W/ASSISTANCE Description: STG Able To Perform Incision/Wound Care With Supervision Assistance. Outcome: Progressing   Problem: RH SAFETY Goal: RH STG ADHERE TO SAFETY PRECAUTIONS W/ASSISTANCE/DEVICE Description: STG Adhere to Safety Precautions With Supervision Assistance/Device. Outcome: Progressing Goal: RH STG DECREASED RISK OF FALL WITH ASSISTANCE Description: STG Decreased Risk of Fall With Assistance. Outcome: Progressing   Problem: RH PAIN MANAGEMENT Goal: RH STG PAIN MANAGED AT OR BELOW PT'S PAIN GOAL Description: <4 on a 0-10 pain scale. Outcome: Progressing   Problem: Consults Goal: RH BRAIN INJURY PATIENT EDUCATION Description: Description: See Patient Education module for eduction specifics Outcome: Not Progressing   Problem: RH BOWEL ELIMINATION Goal: RH STG MANAGE BOWEL WITH ASSISTANCE Description: STG Manage Bowel with Supervision Assistance. Outcome: Not Progressing   Problem: RH BLADDER ELIMINATION Goal: RH STG MANAGE BLADDER WITH ASSISTANCE Description: STG Manage Bladder With Supervision Assistance Outcome: Not Progressing   Problem: RH COGNITION-NURSING Goal: RH STG USES MEMORY AIDS/STRATEGIES W/ASSIST TO PROBLEM SOLVE Description: STG Uses Memory  Aids/Strategies With Cues and Reminders to Problem Solve. Outcome: Not Progressing Goal: RH STG ANTICIPATES NEEDS/CALLS FOR ASSIST W/ASSIST/CUES Description: STG Anticipates Needs/Calls for Assist With Cues and Reminders. Outcome: Not Progressing   Problem: RH KNOWLEDGE DEFICIT BRAIN INJURY Goal: RH STG INCREASE KNOWLEDGE OF SELF CARE AFTER BRAIN INJURY Description: Patient will demonstrate knowledge of medication management, dietary management, weight bearing precautions, skin/wound care with educational materials and handouts provided by staff, at discharge independently. Outcome: Not Progressing
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
## 2020-09-07 MED ORDER — HALOPERIDOL LACTATE 5 MG/ML IJ SOLN
1.0000 mg | Freq: Once | INTRAMUSCULAR | Status: AC
  Administered 2020-09-07: 1 mg via INTRAMUSCULAR
  Filled 2020-09-07: qty 0.2

## 2020-09-07 MED ORDER — OLANZAPINE 5 MG PO TBDP: 2.5000 mg | ORAL_TABLET | Freq: Every day | ORAL | Status: DC

## 2020-09-07 MED ORDER — OLANZAPINE 5 MG PO TBDP
2.5000 mg | ORAL_TABLET | Freq: Every day | ORAL | Status: DC
  Administered 2020-09-07 – 2020-09-30 (×24): 2.5 mg via ORAL
  Filled 2020-09-07 (×25): qty 0.5

## 2020-09-07 MED ORDER — VALPROIC ACID 250 MG/5ML PO SOLN
500.0000 mg | Freq: Three times a day (TID) | ORAL | Status: DC
  Administered 2020-09-07 – 2020-10-01 (×72): 500 mg via ORAL
  Filled 2020-09-07 (×75): qty 10

## 2020-09-07 NOTE — Plan of Care (Signed)
  Problem: Safety: Goal: Non-violent Restraint(s) Outcome: Progressing   Problem: Consults Goal: RH BRAIN INJURY PATIENT EDUCATION Description: Description: See Patient Education module for eduction specifics Outcome: Progressing Goal: Skin Care Protocol Initiated - if Braden Score 18 or less Description: If consults are not indicated, leave blank or document N/A Outcome: Progressing   Problem: RH BOWEL ELIMINATION Goal: RH STG MANAGE BOWEL WITH ASSISTANCE Description: STG Manage Bowel with Supervision Assistance. Outcome: Progressing Goal: RH STG MANAGE BOWEL W/MEDICATION W/ASSISTANCE Description: STG Manage Bowel with Medication with Supervision Assistance. Outcome: Progressing   Problem: RH BLADDER ELIMINATION Goal: RH STG MANAGE BLADDER WITH ASSISTANCE Description: STG Manage Bladder With Supervision Assistance Outcome: Progressing Goal: RH STG MANAGE BLADDER WITH MEDICATION WITH ASSISTANCE Description: STG Manage Bladder With Medication With Supervision Assistance. Outcome: Progressing   Problem: RH SKIN INTEGRITY Goal: RH STG ABLE TO PERFORM INCISION/WOUND CARE W/ASSISTANCE Description: STG Able To Perform Incision/Wound Care With Supervision Assistance. Outcome: Progressing   Problem: RH SAFETY Goal: RH STG DECREASED RISK OF FALL WITH ASSISTANCE Description: STG Decreased Risk of Fall With Assistance. Outcome: Progressing   Problem: RH COGNITION-NURSING Goal: RH STG ANTICIPATES NEEDS/CALLS FOR ASSIST W/ASSIST/CUES Description: STG Anticipates Needs/Calls for Assist With Cues and Reminders. Outcome: Progressing   Problem: RH PAIN MANAGEMENT Goal: RH STG PAIN MANAGED AT OR BELOW PT'S PAIN GOAL Description: <4 on a 0-10 pain scale. Outcome: Progressing

## 2020-09-07 NOTE — Progress Notes (Signed)
Patient is very agitated since beginning of shift. Pt is calling out pt wanted to call Mother call placed pt becoming more upset talking to Mother. Pt aggravated and threw phone on floor. Spoke to pt regarding going home. Pt told Mother to come get him. Spoke to Mother she will come tomorrow morning. Pt remains aggravated.
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
## 2020-09-08 LAB — CBC WITH DIFFERENTIAL/PLATELET
Abs Immature Granulocytes: 0.06 10*3/uL (ref 0.00–0.07)
Basophils Absolute: 0.1 10*3/uL (ref 0.0–0.1)
Basophils Relative: 1 %
Eosinophils Absolute: 0.1 10*3/uL (ref 0.0–0.5)
Eosinophils Relative: 2 %
HCT: 42.8 % (ref 39.0–52.0)
Hemoglobin: 13.9 g/dL (ref 13.0–17.0)
Immature Granulocytes: 1 %
Lymphocytes Relative: 29 %
Lymphs Abs: 2.1 10*3/uL (ref 0.7–4.0)
MCH: 27.7 pg (ref 26.0–34.0)
MCHC: 32.5 g/dL (ref 30.0–36.0)
MCV: 85.3 fL (ref 80.0–100.0)
Monocytes Absolute: 0.9 10*3/uL (ref 0.1–1.0)
Monocytes Relative: 12 %
Neutro Abs: 3.9 10*3/uL (ref 1.7–7.7)
Neutrophils Relative %: 55 %
Platelets: 1013 10*3/uL (ref 150–400)
RBC: 5.02 MIL/uL (ref 4.22–5.81)
RDW: 13 % (ref 11.5–15.5)
WBC: 7.2 10*3/uL (ref 4.0–10.5)
nRBC: 0 % (ref 0.0–0.2)

## 2020-09-08 LAB — COMPREHENSIVE METABOLIC PANEL
ALT: 39 U/L (ref 0–44)
AST: 31 U/L (ref 15–41)
Albumin: 3.8 g/dL (ref 3.5–5.0)
Alkaline Phosphatase: 115 U/L (ref 38–126)
Anion gap: 10 (ref 5–15)
BUN: 21 mg/dL — ABNORMAL HIGH (ref 6–20)
CO2: 29 mmol/L (ref 22–32)
Calcium: 10.2 mg/dL (ref 8.9–10.3)
Chloride: 98 mmol/L (ref 98–111)
Creatinine, Ser: 0.79 mg/dL (ref 0.61–1.24)
GFR, Estimated: >60 mL/min (ref >60)
Glucose, Bld: 95 mg/dL (ref 70–99)
Potassium: 4.2 mmol/L (ref 3.5–5.1)
Sodium: 137 mmol/L (ref 135–145)
Total Bilirubin: 0.9 mg/dL (ref 0.3–1.2)
Total Protein: 8 g/dL (ref 6.5–8.1)
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
## 2020-09-08 NOTE — Progress Notes (Signed)
Speech Language Pathology TBI Note  Patient Details  Name: Steve Mejia MRN: [id number] Date of Birth: [birth year]  Today's Date: 09/08/2020 SLP Individual Time: 1025-1120 SLP Individual Time Calculation (min): 55 min  Short Term Goals: Week 1: SLP Short Term Goal 1 (Week 1): Pt will follow 2-3 step directions with Min A cues SLP Short Term Goal 2 (Week 1): Pt will name objects and function with Supervision A cues SLP Short Term Goal 3 (Week 1): Pt will orient to time, place, self and recent medical events with min A verbal and visual cues SLP Short Term Goal 4 (Week 1): Pt will increase sustained attention to 10 minutes provided mod A verbal cues SLP Short Term Goal 5 (Week 1): Pt will tolerate thin full liquid diet with no s/s aspiration with min A cues for strategies to reduce impulsivity and anterior loss SLP Short Term Goal 6 (Week 1): Pt will participate in standardized cognitive assessment to further determine ST POC  Skilled Therapeutic Interventions: Skilled treatment session focused on dysphagia and cognitive goals. SLP facilitated session by providing thin liquids per his request. Patient consumed liquid without overt s/s of aspiration but patient did have a moderate amount of salvia anteriorly spill from the right side of his oral cavity requiring constant cues to self-monitor and correct. Patient was extremely impulsive throughout session but was easily redirected to tasks. Moderate verbal cues needed for appropriate problem solving with basic tasks, suspect impacted by poor attention. Patient oriented to situation and could identify 2 physical changes but required total A for orientation to place, date and recall of brain injury. Patient requested to use the commode and was continent of bowel. Patient returned to enclosure bed at end of session. Continue with current plan of care.      Pain Pain Assessment Pain Scale: 0-10 Pain Score: 5 Stomach Pain RN aware and administered  medications SLP provided distraction and repositioning   Agitated Behavior Scale: TBI Observation Details Observation Environment: Patient's room Start of observation period - Date: 09/08/20 Start of observation period - Time: 1025 End of observation period - Date: 09/08/20 End of observation period - Time: 1120 Agitated Behavior Scale (DO NOT LEAVE BLANKS) Short attention span, easy distractibility, inability to concentrate: Present to an extreme degree Impulsive, impatient, low tolerance for pain or frustration: Present to an extreme degree Uncooperative, resistant to care, demanding: Absent Violent and/or threatening violence toward people or property: Absent Explosive and/or unpredictable anger: Absent Rocking, rubbing, moaning, or other self-stimulating behavior: Absent Pulling at tubes, restraints, etc.: Absent Wandering from treatment areas: Present to a slight degree Restlessness, pacing, excessive movement: Present to a slight degree Repetitive behaviors, motor, and/or verbal: Present to a slight degree Rapid, loud, or excessive talking: Present to a slight degree Sudden changes of mood: Absent Easily initiated or excessive crying and/or laughter: Absent Self-abusiveness, physical and/or verbal: Absent Agitated behavior scale total score: 24  Therapy/Group: Individual Therapy  Jamarea Selner 09/08/2020, 12:38 PM

## 2020-09-08 NOTE — Plan of Care (Signed)
  Problem: Safety: Goal: Non-violent Restraint(s) Outcome: Progressing   Problem: Consults Goal: RH BRAIN INJURY PATIENT EDUCATION Description: Description: See Patient Education module for eduction specifics Outcome: Progressing Goal: Skin Care Protocol Initiated - if Braden Score 18 or less Description: If consults are not indicated, leave blank or document N/A Outcome: Progressing   Problem: RH BOWEL ELIMINATION Goal: RH STG MANAGE BOWEL WITH ASSISTANCE Description: STG Manage Bowel with Supervision Assistance. Outcome: Progressing Goal: RH STG MANAGE BOWEL W/MEDICATION W/ASSISTANCE Description: STG Manage Bowel with Medication with Supervision Assistance. Outcome: Progressing   Problem: RH BLADDER ELIMINATION Goal: RH STG MANAGE BLADDER WITH ASSISTANCE Description: STG Manage Bladder With Supervision Assistance Outcome: Progressing Goal: RH STG MANAGE BLADDER WITH MEDICATION WITH ASSISTANCE Description: STG Manage Bladder With Medication With Supervision Assistance. Outcome: Progressing   Problem: RH SKIN INTEGRITY Goal: RH STG ABLE TO PERFORM INCISION/WOUND CARE W/ASSISTANCE Description: STG Able To Perform Incision/Wound Care With Supervision Assistance. Outcome: Progressing   Problem: RH SAFETY Goal: RH STG ADHERE TO SAFETY PRECAUTIONS W/ASSISTANCE/DEVICE Description: STG Adhere to Safety Precautions With Supervision Assistance/Device. Outcome: Progressing Goal: RH STG DECREASED RISK OF FALL WITH ASSISTANCE Description: STG Decreased Risk of Fall With Assistance. Outcome: Progressing   Problem: RH COGNITION-NURSING Goal: RH STG USES MEMORY AIDS/STRATEGIES W/ASSIST TO PROBLEM SOLVE Description: STG Uses Memory Aids/Strategies With Cues and Reminders to Problem Solve. Outcome: Progressing Goal: RH STG ANTICIPATES NEEDS/CALLS FOR ASSIST W/ASSIST/CUES Description: STG Anticipates Needs/Calls for Assist With Cues and Reminders. Outcome: Progressing   Problem: RH  PAIN MANAGEMENT Goal: RH STG PAIN MANAGED AT OR BELOW PT'S PAIN GOAL Description: <4 on a 0-10 pain scale. Outcome: Progressing   Problem: RH KNOWLEDGE DEFICIT BRAIN INJURY Goal: RH STG INCREASE KNOWLEDGE OF SELF CARE AFTER BRAIN INJURY Description: Patient will demonstrate knowledge of medication management, dietary management, weight bearing precautions, skin/wound care with educational materials and handouts provided by staff, at discharge independently. Outcome: Progressing

## 2020-09-08 NOTE — Progress Notes (Signed)
Patient information reviewed and entered into [facility] System by Becky Haylie Mccutcheon, PPS coordinator. Information including medical coding, function ability, and quality indicators will be reviewed and updated through discharge.   

## 2020-09-08 NOTE — Care Management (Signed)
[facility]patient [facility] Individual Statement of Services  Patient Name:  Steve Mejia  Date:  09/08/2020  Welcome to the [facility]patient [facility].  Our goal is to provide you with an individualized program based on your diagnosis and situation, designed to meet your specific needs.  With this comprehensive rehabilitation program, you will be expected to participate in at least 3 hours of rehabilitation therapies Monday-Friday, with modified therapy programming on the weekends.  Your rehabilitation program will include the following services:  Physical Therapy (PT), Occupational Therapy (OT), Speech Therapy (ST), 24 hour per day rehabilitation nursing, Therapeutic Recreaction (TR), Psychology, Neuropsychology, Care Coordinator, Rehabilitation Medicine, Nutrition Services, Pharmacy Services and Other  Weekly team conferences will be held on Tuesdays to discuss your progress.  Your Inpatient Rehabilitation Care Coordinator will talk with you frequently to get your input and to update you on team discussions.  Team conferences with you and your family in attendance may also be held.  Expected length of stay: 25-28 days  Overall anticipated outcome: Supervision  Depending on your progress and recovery, your program may change. Your Inpatient Rehabilitation Care Coordinator will coordinate services and will keep you informed of any changes. Your Inpatient Rehabilitation Care Coordinator's name and contact numbers are listed  below.  The following services may also be recommended but are not provided by the Inpatient Rehabilitation Center:   Driving Evaluations  Home Health Rehabiltiation Services  Outpatient Rehabilitation Services  Vocational Rehabilitation   Arrangements will be made to provide these services after discharge if needed.  Arrangements include referral to agencies that provide these services.  Your insurance has been verified to be:  Uninsured  Your primary  doctor is:  Susanne Greenhouse  Pertinent information will be shared with your doctor and your insurance company.  Inpatient Rehabilitation Care Coordinator:  Susie Cassette [phone] or ([phone]  Information discussed with and copy given to patient by: Gretchen Short, 09/08/2020, 9:58 AM

## 2020-09-08 NOTE — Progress Notes (Signed)
Patient ID: Steve Mejia, male   DOB: 2000/11/19, 20 y.o.   MRN: [id number]  SW made several attempts to meet with pt, but pt sleeping each time SW came to room.  SW completed assessment with pt mother Steve Mejia [phone]). Pt mother reports the plan is likely for pt to d/c to his grandmother's home since she lives on a one level home, and she lives in an apartment on second floor; step mother and father live in 2nd level apartment as well. Pt mother states pt will have 24/7 care from various family members that have volunteered to be with him while she works. Pt mother states in highschool he had an IEP due to a cognitively delay (not specified), and ADHD. SW to follow-up tomorrow after team conference with updates.   Cecile Sheerer, MSW, LCSWA Office: [phone] Cell: [phone] Fax: [phone]
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
## 2020-09-09 LAB — CBC WITH DIFFERENTIAL/PLATELET
Abs Immature Granulocytes: 0.03 10*3/uL (ref 0.00–0.07)
Basophils Absolute: 0.1 10*3/uL (ref 0.0–0.1)
Basophils Relative: 1 %
Eosinophils Absolute: 0.1 10*3/uL (ref 0.0–0.5)
Eosinophils Relative: 1 %
HCT: 40.2 % (ref 39.0–52.0)
Hemoglobin: 13.2 g/dL (ref 13.0–17.0)
Immature Granulocytes: 0 %
Lymphocytes Relative: 34 %
Lymphs Abs: 2.5 10*3/uL (ref 0.7–4.0)
MCH: 27.8 pg (ref 26.0–34.0)
MCHC: 32.8 g/dL (ref 30.0–36.0)
MCV: 84.8 fL (ref 80.0–100.0)
Monocytes Absolute: 0.8 10*3/uL (ref 0.1–1.0)
Monocytes Relative: 11 %
Neutro Abs: 3.8 10*3/uL (ref 1.7–7.7)
Neutrophils Relative %: 53 %
Platelets: 982 10*3/uL (ref 150–400)
RBC: 4.74 MIL/uL (ref 4.22–5.81)
RDW: 12.8 % (ref 11.5–15.5)
WBC: 7.2 10*3/uL (ref 4.0–10.5)
nRBC: 0 % (ref 0.0–0.2)

## 2020-09-09 MED ORDER — ENSURE ENLIVE PO LIQD
237.0000 mL | Freq: Three times a day (TID) | ORAL | Status: DC
  Administered 2020-09-09 – 2020-10-01 (×56): 237 mL via ORAL

## 2020-09-09 NOTE — Progress Notes (Signed)
Patient ID: Steve Mejia, male   DOB: 06-25-2000, 20 y.o.   MRN: [id number]  SW met with pt and pt mother in room to provide updates from team conference, and d/c 5/18/. SW to continue to provide updates.   Loralee Pacas, MSW, [place] Office: [phone] Cell: [phone] Fax: [phone]

## 2020-09-09 NOTE — Progress Notes (Signed)
Speech Language Pathology TBI Note  Patient Details  Name: Steve Mejia MRN: [id number] Date of Birth: November 24, 2000  Today's Date: 09/09/2020 SLP Individual Time: 0900-1000 SLP Individual Time Calculation (min): 60 min  Short Term Goals: Week 1: SLP Short Term Goal 1 (Week 1): Pt will follow 2-3 step directions with Min A cues SLP Short Term Goal 2 (Week 1): Pt will name objects and function with Supervision A cues SLP Short Term Goal 3 (Week 1): Pt will orient to time, place, self and recent medical events with min A verbal and visual cues SLP Short Term Goal 4 (Week 1): Pt will increase sustained attention to 10 minutes provided mod A verbal cues SLP Short Term Goal 5 (Week 1): Pt will tolerate thin full liquid diet with no s/s aspiration with min A cues for strategies to reduce impulsivity and anterior loss SLP Short Term Goal 6 (Week 1): Pt will participate in standardized cognitive assessment to further determine ST POC  Skilled Therapeutic Interventions: Skilled treatment session focused on dysphagia and cognitive goals. Upon arrival, patient was asleep and slow to awaken. Patient reported he was hungry and SLP provided a snack of Dys. 1 textures with thin liquids. Patient consumed snack without overt s/s of aspiration but patient did have a moderate amount of salvia anteriorly spill from the right side of his oral cavity requiring constant cues to self-monitor and correct and required moderate verbal cues to for a slow rate of self-feeding and small bites/sips. Moderate verbal cues were also needed during meal as patient was attempting to use a spoon as a straw and poured his ice and water into the applesauce cup. Patient was extremely impulsive throughout session but was easily redirected to tasks. Minimal verbal cues were needed for orientation to place and patient organized and utilized a basic calendar with supervision verbal cues.  Patient requested to use the commode and required Mod  verbal cues for safety with task due to impulsivity. Patient was continent of urine. Patient returned to enclosure bed at end of session. Patient left secured in enclosure bed and all needs within reach. Continue with current plan of care.          Pain Pain Assessment Pain Scale: Faces Faces Pain Scale: Hurts whole lot Pain Type: Surgical pain Pain Location: Chest Pain Orientation: Mid Pain Descriptors / Indicators: Aching Pain Frequency: Intermittent Pain Onset: Sudden Pain Intervention(s): Medication (See eMAR)  Agitated Behavior Scale: TBI Observation Details Observation Environment: Patient's room Start of observation period - Date: 09/09/20 Start of observation period - Time: 0900 End of observation period - Date: 09/09/20 End of observation period - Time: 1000 Agitated Behavior Scale (DO NOT LEAVE BLANKS) Short attention span, easy distractibility, inability to concentrate: Present to a moderate degree Impulsive, impatient, low tolerance for pain or frustration: Present to a moderate degree Uncooperative, resistant to care, demanding: Absent Violent and/or threatening violence toward people or property: Absent Explosive and/or unpredictable anger: Absent Rocking, rubbing, moaning, or other self-stimulating behavior: Absent Pulling at tubes, restraints, etc.: Absent Wandering from treatment areas: Absent Restlessness, pacing, excessive movement: Present to a slight degree Repetitive behaviors, motor, and/or verbal: Present to a slight degree Rapid, loud, or excessive talking: Absent Sudden changes of mood: Absent Easily initiated or excessive crying and/or laughter: Absent Self-abusiveness, physical and/or verbal: Absent Agitated behavior scale total score: 20  Therapy/Group: Individual Therapy  Saesha Llerenas 09/09/2020, 12:42 PM
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
## 2020-09-10 MED ORDER — METOPROLOL TARTRATE 25 MG/10 ML ORAL SUSPENSION
25.0000 mg | Freq: Two times a day (BID) | ORAL | Status: DC
  Administered 2020-09-10 – 2020-10-01 (×35): 25 mg via ORAL
  Filled 2020-09-10 (×43): qty 10

## 2020-09-10 MED ORDER — OXYCODONE HCL 5 MG/5ML PO SOLN
5.0000 mg | ORAL | Status: DC | PRN
  Administered 2020-09-10: 10 mg via ORAL
  Administered 2020-09-11: 5 mg via ORAL
  Administered 2020-09-11: 10 mg via ORAL
  Filled 2020-09-10 (×2): qty 10
  Filled 2020-09-10: qty 5

## 2020-09-10 MED ORDER — ACETAMINOPHEN 160 MG/5ML PO SOLN
650.0000 mg | Freq: Four times a day (QID) | ORAL | Status: DC
  Administered 2020-09-10 – 2020-10-01 (×72): 650 mg via ORAL
  Filled 2020-09-10 (×79): qty 20.3
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
## 2020-09-10 NOTE — Progress Notes (Signed)
Speech Language Pathology TBI Note  Patient Details  Name: Steve Mejia MRN: [id number] Date of Birth: 29-Jun-2000  Today's Date: 09/10/2020 SLP Individual Time: 1210-1245 SLP Individual Time Calculation (min): 35 min  Short Term Goals: Week 1: SLP Short Term Goal 1 (Week 1): Pt will follow 2-3 step directions with Min A cues SLP Short Term Goal 2 (Week 1): Pt will name objects and function with Supervision A cues SLP Short Term Goal 3 (Week 1): Pt will orient to time, place, self and recent medical events with min A verbal and visual cues SLP Short Term Goal 4 (Week 1): Pt will increase sustained attention to 10 minutes provided mod A verbal cues SLP Short Term Goal 5 (Week 1): Pt will tolerate thin full liquid diet with no s/s aspiration with min A cues for strategies to reduce impulsivity and anterior loss SLP Short Term Goal 6 (Week 1): Pt will participate in standardized cognitive assessment to further determine ST POC  Skilled Therapeutic Interventions: Skilled treatment session focused on cognitive goals. SLP facilitated session by providing Max A verbal cues for sustained attention to self-feeding. Patient perseverative on "[employer]" and required multiple attempts at redirection and educating regarding why he is unable to eat bread at this time. Patient consumed his full liquid diet without overt s/s of aspiration but continues to utilize a variety of mixtures of items on his tray. Patient requested to use the bathroom and required Max verbal cues for safety and problem solving with task. Patient was continent of bowel. Patient requested to return back to bed. Patient left in enclosure bed with all needs within reach. Continue with current plan of care.      Pain No/Denies Pain   Agitated Behavior Scale: TBI Observation Details Observation Environment: Patient's room Start of observation period - Date: 09/10/20 Start of observation period - Time: 1210 End of observation  period - Date: 09/10/20 End of observation period - Time: 1245 Agitated Behavior Scale (DO NOT LEAVE BLANKS) Short attention span, easy distractibility, inability to concentrate: Present to a moderate degree Impulsive, impatient, low tolerance for pain or frustration: Present to a moderate degree Uncooperative, resistant to care, demanding: Absent Violent and/or threatening violence toward people or property: Absent Explosive and/or unpredictable anger: Absent Rocking, rubbing, moaning, or other self-stimulating behavior: Absent Pulling at tubes, restraints, etc.: Absent Wandering from treatment areas: Present to a slight degree Restlessness, pacing, excessive movement: Present to a moderate degree Repetitive behaviors, motor, and/or verbal: Present to a moderate degree Rapid, loud, or excessive talking: Absent Sudden changes of mood: Absent Easily initiated or excessive crying and/or laughter: Absent Self-abusiveness, physical and/or verbal: Absent Agitated behavior scale total score: 23  Therapy/Group: Individual Therapy  Kenesha Moshier 09/10/2020, 3:05 PM
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
## 2020-09-11 NOTE — Progress Notes (Signed)
Speech Language Pathology TBI Note  Patient Details  Name: Steve Mejia MRN: [id number] Date of Birth: June 25, 2000  Today's Date: 09/11/2020 SLP Individual Time: 1607-3710 SLP Individual Time Calculation (min): 45 min  Short Term Goals: Week 1: SLP Short Term Goal 1 (Week 1): Pt will follow 2-3 step directions with Min A cues SLP Short Term Goal 2 (Week 1): Pt will name objects and function with Supervision A cues SLP Short Term Goal 3 (Week 1): Pt will orient to time, place, self and recent medical events with min A verbal and visual cues SLP Short Term Goal 4 (Week 1): Pt will increase sustained attention to 10 minutes provided mod A verbal cues SLP Short Term Goal 5 (Week 1): Pt will tolerate thin full liquid diet with no s/s aspiration with min A cues for strategies to reduce impulsivity and anterior loss SLP Short Term Goal 6 (Week 1): Pt will participate in standardized cognitive assessment to further determine ST POC  Skilled Therapeutic Interventions: Skilled treatment session focused on cognitive goals. Upon arrival, patient was asleep in bed and required extra time for arousal. Patient transferred to the wheelchair and performed tray set-up with overall Mod I. Patient consumed full liquid meal with Mod I. SLP also facilitated session by providing supervision verbal cues for problem solving and attention during a sorting task from a field of 4. Patient requested to use the bathroom and was continent of urine but required Max verbal cues for safety with task. Patient left upright in bed with alarm on and all needs within reach. Continue with current plan of care.      Pain No/Denies Pain   Agitated Behavior Scale: TBI Observation Details Observation Environment: Patient's room Start of observation period - Date: 09/11/20 Start of observation period - Time: 0730 End of observation period - Date: 09/11/20 End of observation period - Time: 0815 Agitated Behavior Scale (DO NOT  LEAVE BLANKS) Short attention span, easy distractibility, inability to concentrate: Present to a moderate degree Impulsive, impatient, low tolerance for pain or frustration: Present to a moderate degree Uncooperative, resistant to care, demanding: Absent Violent and/or threatening violence toward people or property: Absent Explosive and/or unpredictable anger: Absent Rocking, rubbing, moaning, or other self-stimulating behavior: Absent Pulling at tubes, restraints, etc.: Absent Wandering from treatment areas: Present to a slight degree Restlessness, pacing, excessive movement: Present to a slight degree Repetitive behaviors, motor, and/or verbal: Present to a slight degree Rapid, loud, or excessive talking: Absent Sudden changes of mood: Absent Easily initiated or excessive crying and/or laughter: Absent Self-abusiveness, physical and/or verbal: Absent Agitated behavior scale total score: 21  Therapy/Group: Individual Therapy  Steve Mejia 09/11/2020, 3:34 PM

## 2020-09-11 NOTE — Progress Notes (Signed)
Occupational Therapy TBI Note  Patient Details  Name: Steve Mejia MRN: [id number] Date of Birth: 07/12/2000  Today's Date: 09/11/2020 OT Individual Time: 1102-1200 OT Individual Time Calculation (min): 58 min   Short Term Goals: Week 1:  OT Short Term Goal 1 (Week 1): Pt will complete toilet transfer with 1 assist and LRAD to progress with OOB toileting OT Short Term Goal 2 (Week 1): Pt will complete sit<stand with Min A during LB dressing during 2 consecutive OT sessions OT Short Term Goal 3 (Week 1): Pt will complete 1 grooming tasks at the sink with min cues for attention, sequencing, and awareness  Skilled Therapeutic Interventions/Progress Updates:    Pt greeted ambulating out of bathroom with nurse tech, handoff to OT. Worked on standing balance/endurance for standing to wash hands and brush teeth with min A for balance. Pt then ambulated to wc with mod A and intermittent scissoring of LES. Pt brought to therapy gym and worked on problem solving and visual scanning with graded peg board puzzle. Pt did well with 2 color pattern, then moved to 5 color patten in a "t" shape in all 4 quadrants. OT educated on organizing peg board colors into quadrants instead of jumping all around the mat. Functional ambulation with min/mod A and worked on walking in a straight line. Pt all of a sudden started squatting down to the floor requiring max A to come back to standing. Pt reported he needed to squat to fix his knee. OT educated on why that was unsafe at this time. Pt returned to room and completed stand-pivot back to bed with min A. Pt left semi-reclined in enclosure bed with needs met.   Therapy Documentation Precautions:  Precautions Precautions: Fall,Other (comment),Sternal Precaution Comments: TBI, sternal, NG tube and full liquid diet Restrictions Weight Bearing Restrictions: No Other Position/Activity Restrictions: Sternal Precautions Pain:  denies pain Agitated Behavior  Scale: TBI Observation Details Observation Environment: Gym Start of observation period - Date: 09/11/20 Start of observation period - Time: 1100 End of observation period - Date: 09/11/20 End of observation period - Time: 1200 Agitated Behavior Scale (DO NOT LEAVE BLANKS) Short attention span, easy distractibility, inability to concentrate: Present to a moderate degree Impulsive, impatient, low tolerance for pain or frustration: Present to a moderate degree Uncooperative, resistant to care, demanding: Absent Violent and/or threatening violence toward people or property: Absent Explosive and/or unpredictable anger: Absent Rocking, rubbing, moaning, or other self-stimulating behavior: Absent Pulling at tubes, restraints, etc.: Absent Wandering from treatment areas: Present to a slight degree Restlessness, pacing, excessive movement: Present to a slight degree Repetitive behaviors, motor, and/or verbal: Present to a moderate degree Rapid, loud, or excessive talking: Absent Sudden changes of mood: Absent Easily initiated or excessive crying and/or laughter: Absent Self-abusiveness, physical and/or verbal: Absent Agitated behavior scale total score: 22   Therapy/Group: Individual Therapy   S  09/11/2020, 12:40 PM  

## 2020-09-11 NOTE — Progress Notes (Signed)
Physical Therapy Session Note  Patient Details  Name: Steve Mejia MRN: [id number] Date of Birth: 04-Sep-2000  Today's Date: 09/11/2020 PT Individual Time: 3086-5784 PT Individual Time Calculation (min): 54 min   Short Term Goals: Week 1:  PT Short Term Goal 1 (Week 1): Pt will ambulate 123ft w/mod assist of 2 PT Short Term Goal 2 (Week 1): Pt will perform stand pivot transfer to wc w/mod assist of 1 PT Short Term Goal 3 (Week 1): Pt will stand statically w/bilat UE to single UE support and mod assist of 1 during functional task/activity PT Short Term Goal 4 (Week 1): Pt will sit on edge of bed/mat w/min assist for at least 1 min  Skilled Therapeutic Interventions/Progress Updates:     Pt received supine in bed and agrees to therapy. Reports no pain. Supine to sit with supervision and cues for positioning. Stand pivot transfer to [facility] with minA. WC transport to gym for time management. Pt performs NMR for standing balance and cognition overlay with Wii bowling game. Pt performs multiple reps of sit to stand during game with minA for stability and increased postural sway. Pt then ambulates multiple bouts of x200' with minA. PT provides pt with cognitive challenge during ambulation, and pt demonstrates significant difficulty performing dual task. Pt tends to focus on objects within visual field and name them, regardless of assigned task. Pt transfers back to bed with minA. Left in posey with call bell within reach.  Therapy Documentation Precautions:  Precautions Precautions: Fall,Other (comment),Sternal Precaution Comments: TBI, sternal, NG tube and full liquid diet Restrictions Weight Bearing Restrictions: No Other Position/Activity Restrictions: Sternal Precautions    Therapy/Group: Individual Therapy  Beau Fanny, PT, DPT 09/11/2020, 4:09 PM

## 2020-09-11 NOTE — Plan of Care (Signed)
  Problem: RH SAFETY Goal: RH STG ADHERE TO SAFETY PRECAUTIONS W/ASSISTANCE/DEVICE Description: STG Adhere to Safety Precautions With Supervision Assistance/Device. Outcome: Not Progressing; enclosure bed
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
## 2020-09-11 NOTE — Progress Notes (Signed)
Physical Therapy Session Note  Patient Details  Name: Steve Mejia MRN: [id number] Date of Birth: 11/07/2000  Today's Date: 09/11/2020 PT Individual Time: 0915-1010 PT Individual Time Calculation (min): 55 min   Short Term Goals: Week 1:  PT Short Term Goal 1 (Week 1): Pt will ambulate 12f w/mod assist of 2 PT Short Term Goal 2 (Week 1): Pt will perform stand pivot transfer to wc w/mod assist of 1 PT Short Term Goal 3 (Week 1): Pt will stand statically w/bilat UE to single UE support and mod assist of 1 during functional task/activity PT Short Term Goal 4 (Week 1): Pt will sit on edge of bed/mat w/min assist for at least 1 min  Skilled Therapeutic Interventions/Progress Updates:   Pt received supine in bed and agreeable to PT. RN present for medication administration. Supine>sit transfer with supevision assist for safety. [facility]   Pt performed gait training with min assist from PT on gait belt and to support trunk x [id number] rehab unit and 20 ft in room to use restroom and performed hand hygiene. Pt noted to have occasional adduction and lateral instability throughout, especially in turns, but no severe LOB throughout. Pt also performed scavenger hunt through day room to locate 8 bean bags; pt able to recall order and total number of bean bags throughout task with only min cues. Instruction also provided for improved visual scanning on various planes to locate objects. And avoid obstacles.   Pt attempted to performed solitare game per pt choice, and was able to deal cards with only moderate cues for proper set up. Once pt attempting to play game, pt unable to recall rules of playing order without total assist for attention and problem solving.  Pt performed sustained attention task of seat march on kinetron with instruction to complete for 5 minutes, but pt only able to tolerate 3 min befroe requesting to return to bed.   Pt returned  to room and performed stand pivot transfer to bed with min assist. Sit>supine completed with supervision assist, and left supine in enclosure bed with call bell in reach and all needs met.        Therapy Documentation Precautions:  Precautions Precautions: Fall,Other (comment),Sternal Precaution Comments: TBI, sternal, NG tube and full liquid diet Restrictions Weight Bearing Restrictions: No Other Position/Activity Restrictions: Sternal Precautions Pain: Faces: hurts a little. Shoulders. Pt repositioned.    Therapy/Group: Individual Therapy  AustLorie Phenix8/2022, 10:19 AM

## 2020-09-12 MED ORDER — MUSCLE RUB 10-15 % EX CREA
TOPICAL_CREAM | CUTANEOUS | Status: DC | PRN
  Filled 2020-09-12: qty 85

## 2020-09-12 NOTE — Progress Notes (Signed)
Speech Language Pathology Weekly Progress and Session Note  Patient Details  Name: Steve Mejia MRN: [id number] Date of Birth: March 18, 2001  Beginning of progress report period: September 05, 2020 End of progress report period: September 12, 2020  Today's Date: 09/12/2020 SLP Individual Time: 0915-1000 SLP Individual Time Calculation (min): 45 min  Short Term Goals: Week 1: SLP Short Term Goal 1 (Week 1): Pt will follow 2-3 step directions with Min A cues SLP Short Term Goal 1 - Progress (Week 1): Met SLP Short Term Goal 2 (Week 1): Pt will name objects and function with Supervision A cues SLP Short Term Goal 2 - Progress (Week 1): Met SLP Short Term Goal 3 (Week 1): Pt will orient to time, place, self and recent medical events with min A verbal and visual cues SLP Short Term Goal 3 - Progress (Week 1): Met SLP Short Term Goal 4 (Week 1): Pt will increase sustained attention to 10 minutes provided mod A verbal cues SLP Short Term Goal 4 - Progress (Week 1): Met SLP Short Term Goal 5 (Week 1): Pt will tolerate thin full liquid diet with no s/s aspiration with min A cues for strategies to reduce impulsivity and anterior loss SLP Short Term Goal 5 - Progress (Week 1): Met SLP Short Term Goal 6 (Week 1): Pt will participate in standardized cognitive assessment to further determine ST POC SLP Short Term Goal 6 - Progress (Week 1): Not met    New Short Term Goals: Week 2: SLP Short Term Goal 1 (Week 2): Patient will consume current diet with minimal overt s/s of aspiration with Mod I for use of swallowing compensatory strategies. SLP Short Term Goal 2 (Week 2): Patient will utilize external aids for orientation to time, place and situation with suprevision level verbal and visual cues. SLP Short Term Goal 3 (Week 2): Patient will demonstrate selective attention to tasks in a mildly dsitracting enviornment for 30 minutes with Min verbal cues for redirection. SLP Short Term Goal 4 (Week 2): Patient  will self-monitor and correct errors during functional tsaks with Min verbal cues. SLP Short Term Goal 5 (Week 2): Patient will demonstrate functional problem solving for basic and familiar tasks with Mod verbal cues. SLP Short Term Goal 6 (Week 2): Patient will reduce impulsivity and will count to 3 aloud prior to initiating movement with Max verbal cues.  Weekly Progress Updates: Patient has made functional gains and has met 5 of 6 STGs this reporting period. Currently, patient demonstrates behaviors consistent with a Rancho level VI and requires overall Mod-Max A multimodal cues to complete functional and familiar tasks safely in regards to attention, problem solving, safety and awareness. Patient demonstrates improved attention and orientation and is beginning to recognize and recall staff member's names. Patient is consuming a full liquid diet without overt s/s of aspiration and supervision verbal cues for use of swallowing compensatory strategies. Patient will remain on this diet per physician's orders. Patient is communicating his wants/needs without evidence of word-finding deficits and demonstrates appropriate auditory comprehension with basic tasks. Patient and family education ongoing.  Patient would benefit from continued skilled SLP intervention to maximize his cognitive functioning and overall functional independence prior to discharge.      Intensity: Minumum of 1-2 x/day, 30 to 90 minutes Frequency: 3 to 5 out of 7 days Duration/Length of Stay: 10/01/20 Treatment/Interventions: Cognitive remediation/compensation;Speech/Language facilitation;Cueing hierarchy;Functional tasks;Therapeutic Activities;Therapeutic Exercise;Dysphagia/aspiration precaution training;Medication managment;Patient/family education;Environmental controls   Daily Session  Skilled Therapeutic Interventions: Skilled treatment session  focused on cognitive goals. SLP facilitated session by providing extra time for  completion of a basic calendar task. Although patient required extra time, patient was overall Mod I for problem solving and sustained attention to task for ~20 minutes. Patient also completed a basic money management task with Min verbal cues. Patient handed off to RN. Continue with current plan of care.     Pain No/Denies Pain   Therapy/Group: Individual Therapy  Kao Berkheimer 09/12/2020, 6:36 AM
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
## 2020-09-12 NOTE — Plan of Care (Addendum)
Behavioral Plan   Rancho Level: VII  Behavior to decrease/ eliminate:  Impulsivity/safety awareness  Changes to environment:  Blinds open during the day Make sure call bell and phone are plugged into the wall and in the bed with him Patient gets cold easily, make sure he has plenty of blankets so he does not call out into the hallway Patient can use room phone during the day but please make sure to remove it at night from his bed so that he can sleep  Interventions: Sleep/wake chart  Help patient ambulate into bathroom with Hands on assist instead of using urinal. DO NOT LEAVE ALONE IN BATHROOM OR IN CHAIR  Recommendations for interactions with patient:   Attendees:  Malachi Pro, DPT Feliberto Gottron, SLP Gentry Fitz Doe, OTR/L   Dys. 1 DIET, PLEASE DON'T GIVE ANY SOLIDS HE HAS LIMITED JAW RANGE OF MOTION *If he gets [employer] soup, please make sure the baguette is out of the bag*

## 2020-09-12 NOTE — Progress Notes (Signed)
Occupational Therapy Weekly Progress Note  Patient Details  Name: Steve Mejia MRN: [id number] Date of Birth: [birth year]  Beginning of progress report period: September 06, 2020 End of progress report period: September 12, 2020  Today's Date: 09/12/2020 OT Individual Time: 5465-6812 OT Individual Time Calculation (min): 72 min    Patient has met 3 of 3 short term goals.  Pt is making steady progress towards OT goals. Pt is at an overall min/mod A level for BADL tasks, but continues to need mod verbal cues for safety and awareness within functional tasks. Continue current POC.   Patient continues to demonstrate the following deficits: muscle weakness, decreased initiation, decreased attention, decreased awareness, decreased problem solving, decreased safety awareness, decreased memory and delayed processing and decreased sitting balance, decreased standing balance, decreased postural control, hemiplegia and decreased balance strategies and therefore will continue to benefit from skilled OT intervention to enhance overall performance with BADL.  Patient progressing toward long term goals..  Continue plan of care.  OT Short Term Goals Week 1:  OT Short Term Goal 1 (Week 1): Pt will complete toilet transfer with 1 assist and LRAD to progress with OOB toileting OT Short Term Goal 1 - Progress (Week 1): Met OT Short Term Goal 2 (Week 1): Pt will complete sit<stand with Min A during LB dressing during 2 consecutive OT sessions OT Short Term Goal 2 - Progress (Week 1): Met OT Short Term Goal 3 (Week 1): Pt will complete 1 grooming tasks at the sink with min cues for attention, sequencing, and awareness OT Short Term Goal 3 - Progress (Week 1): Met Week 2:  OT Short Term Goal 1 (Week 2): Patient will complete bathing tasks with mod verbal cues for safety OT Short Term Goal 2 (Week 2): Patient will complete 2 steps of toileting task with min A OT Short Term Goal 3 (Week 2): Patient will maintain  attention to self feeding task with min cues  Skilled Therapeutic Interventions/Progress Updates:    Pt greeted semi-reclined in enclosure bed. Pt initially declined to get OOB and just wanted to sleep. OT gave pt a few minutes, then encouraged him to shower. OT had gotten patient lavender soap to bathe with and this made him agreeable to shower. Functional ambulation to bathroom with min A today and less gait perturbations. Pt declined to use the bathroom. Min A for dynamic standing balance while stepping out of pants. Verbal cues to hold onto grab bar while stepping out of pants. Bathing completed with increased time and min A for balance when standing to wash buttocks. Pt very thorough with bathing, washing body parts 2x. Behavior did not seem like persevrations though, more like he just wanted to be thorough. [name] that he usually takes 2 showers a day. Dressing completed from wc in bathroom 2/2 the room being too cold. Pt correctly oriented and donned clothing with min A for balance in standing. Pt set-up for breakfast and worked on naming each item prior to consumption. Pt internally distracted at times and stated, "[name], I fell in love with my sisters best friend, that's why I loose my balance sometimes. I'm thinking about her." Pt needed redirection to change subject from this girl. Pt ambulated back to bed at end of session with min A and left semi-reclined in enclosure bed.   Therapy Documentation Precautions:  Precautions Precautions: Fall,Other (comment),Sternal Precaution Comments: TBI, sternal, NG tube and full liquid diet Restrictions Weight Bearing Restrictions: No Other Position/Activity Restrictions: Sternal  Precautions Pain:   denies pain  Therapy/Group: Individual Therapy  Steve Mejia 09/12/2020, 10:01 AM

## 2020-09-12 NOTE — Progress Notes (Signed)
Physical Therapy Weekly Progress Note  Patient Details  Name: Steve Mejia MRN: [id number] Date of Birth: 05-28-2000  Beginning of progress report period: September 06, 2020 End of progress report period: September 12, 2020  Today's Date: 09/12/2020 PT Individual Time: 1020-1050 PT Individual Time Calculation (min): 30 min  and Today's Date: 09/12/2020 PT Missed Time: 30 Minutes Missed Time Reason: Patient fatigue  Patient has met 4 of 4 short term goals.  Pt is progressing very well toward mobility goals, improving independence with bed mobility, transfers, ambulation, and balance. Pt continues to be very impulsive, requiring very close supervision throughout sessions, and lacks safety awareness. Pt also has balance deficits and frequently loses balance when attempting to perform bed to chair transfers. Pt attention to task improving and general impulsivity beginning to improve.  Patient continues to demonstrate the following deficits muscle weakness,  , decreased motor planning and  , decreased attention, decreased awareness, decreased problem solving, decreased safety awareness and decreased memory and decreased standing balance, hemiplegia and decreased balance strategies and therefore will continue to benefit from skilled PT intervention to increase functional independence with mobility.  Patient progressing toward long term goals..  Continue plan of care.  PT Short Term Goals Week 1:  PT Short Term Goal 1 (Week 1): Pt will ambulate 177f w/mod assist of 2 PT Short Term Goal 1 - Progress (Week 1): Met PT Short Term Goal 2 (Week 1): Pt will perform stand pivot transfer to wc w/mod assist of 1 PT Short Term Goal 2 - Progress (Week 1): Met PT Short Term Goal 3 (Week 1): Pt will stand statically w/bilat UE to single UE support and mod assist of 1 during functional task/activity PT Short Term Goal 3 - Progress (Week 1): Met PT Short Term Goal 4 (Week 1): Pt will sit on edge of bed/mat w/min  assist for at least 1 min PT Short Term Goal 4 - Progress (Week 1): Met Week 2:  PT Short Term Goal 1 (Week 2): Pt will perform bed to chair transfer with CGA. PT Short Term Goal 2 (Week 2): Pt will ambulate x200' with CGA. PT Short Term Goal 3 (Week 2): Pt will not attempt to stand from chair prior to being cued, demonstrating improvement in impulsivity.  Skilled Therapeutic Interventions/Progress Updates:     [facility] handed off to PT. No complaint of pain. WC transport outside for time management. Pt performs navigation in elevator with supervision for safety. Pt says he is too cold outside and requests to return inside. [facility] Pt attempts to perform memory and targeted reaching task with BITS and performs with minimal cueing, working on reaching outside [facility] Pt becomes "light headed" and requests to sit back down. Following seated rest break, pt performs additional BITS NMR activity, but quickly becomes "dizzy" again and sits back down. PT assesses BP and pt at 120/77. Pt transfers back to bed with minA. Left supine in [place] with call bell within reach. RN notified of symptoms and vitals. Pt misses 30 minutes of PT dues to symptoms and fatigue.  Therapy Documentation Precautions:  Precautions Precautions: Fall,Other (comment),Sternal Precaution Comments: TBI, sternal, NG tube and full liquid diet Restrictions Weight Bearing Restrictions: No Other Position/Activity Restrictions: Sternal Precautions   Therapy/Group: Individual Therapy  WBreck Coons PT, DPT 09/12/2020, 4:25 PM

## 2020-09-12 NOTE — Plan of Care (Signed)
  Problem: RH Problem Solving Goal: LTG Patient will demonstrate problem solving for (SLP) Description: LTG:  Patient will demonstrate problem solving for basic/complex daily situations with cues  (SLP) Flowsheets (Taken 09/12/2020 0645) LTG: Patient will demonstrate problem solving for (SLP): Basic daily situations LTG Patient will demonstrate problem solving for: Supervision Note: Goal added

## 2020-09-13 MED ORDER — BENZOCAINE 10 % MT GEL
Freq: Two times a day (BID) | OROMUCOSAL | Status: DC | PRN
  Filled 2020-09-13: qty 9

## 2020-09-13 NOTE — Progress Notes (Signed)
Informed by staff that patient was found out of enclosure bed at nursing station, stating he was hungry and trying to go down to [employer] for food. He was assisted by staff member back into his room and placed in Enclosure bed, patient is cooperative.   2240 Made as comfortable as possible po liquids and food( peanut butter crackers and juice) provided per request. Monitor and assisted...   09/13/2020 at 0517 Patient asleep and placed on Tele-sitter monitor for safety, monitor technician notifoed to turn on monitor. Patient appears in no distress

## 2020-09-13 NOTE — Progress Notes (Signed)
Occupational Therapy TBI Note  Patient Details  Name: Steve Mejia MRN: [id number] Date of Birth: Apr 22, 2001  Today's Date: 09/13/2020 OT Individual Time: 1000-1029 OT Individual Time Calculation (min): 29 min    Short Term Goals: Week 1:  OT Short Term Goal 1 (Week 1): Pt will complete toilet transfer with 1 assist and LRAD to progress with OOB toileting OT Short Term Goal 1 - Progress (Week 1): Met OT Short Term Goal 2 (Week 1): Pt will complete sit<stand with Min A during LB dressing during 2 consecutive OT sessions OT Short Term Goal 2 - Progress (Week 1): Met OT Short Term Goal 3 (Week 1): Pt will complete 1 grooming tasks at the sink with min cues for attention, sequencing, and awareness OT Short Term Goal 3 - Progress (Week 1): Met  Skilled Therapeutic Interventions/Progress Updates:    1;1. Pt received in bed waiting to get up patiently. Pt completes all standing and functional mobility without AD and MIN A overall. Pt with scissoring legs 2x during mobility in hallway requiring VC/MOD A for recovery. Pt able to path find way back to room after end of session after standing intermittently at [facility]. Memory recall up to 4words in serial order and unable to divide attention to shorter list of words while adjusting blanket at shoulders. Maze test completed in 42 seconds with 1 deviation outside lines and trail making A test in 1 min 31 seconds with 5 errors. Exited session with pt seated in bed, exit alarm on and call light in reach   Therapy Documentation Precautions:  Precautions Precautions: Fall,Other (comment),Sternal Precaution Comments: TBI, sternal, NG tube and full liquid diet Restrictions Weight Bearing Restrictions: No Other Position/Activity Restrictions: Sternal Precautions General:   Vital Signs: Therapy Vitals Temp: 98.2 F (36.8 C) Temp Source: Oral Pulse Rate: 82 Resp: 16 BP: 133/82 Patient Position (if appropriate): Lying Oxygen Therapy SpO2: 100 % O2  Device: Room Air Pain: Pain Assessment Pain Score: 0-No pain Agitated Behavior Scale: TBI  Observation Details Observation Environment: CIR Start of observation period - Date: 09/13/20 Start of observation period - Time: 1000 End of observation period - Date: 09/13/20 End of observation period - Time: 1030 Agitated Behavior Scale (DO NOT LEAVE BLANKS) Short attention span, easy distractibility, inability to concentrate: Present to a moderate degree Impulsive, impatient, low tolerance for pain or frustration: Present to a slight degree Uncooperative, resistant to care, demanding: Absent Violent and/or threatening violence toward people or property: Absent Explosive and/or unpredictable anger: Absent Rocking, rubbing, moaning, or other self-stimulating behavior: Absent Pulling at tubes, restraints, etc.: Absent Wandering from treatment areas: Absent Restlessness, pacing, excessive movement: Present to a slight degree Repetitive behaviors, motor, and/or verbal: Present to a slight degree Rapid, loud, or excessive talking: Absent Sudden changes of mood: Absent Easily initiated or excessive crying and/or laughter: Absent Self-abusiveness, physical and/or verbal: Absent Agitated behavior scale total score: 19  ADL: ADL Eating: Not assessed Grooming: Minimal assistance Where Assessed-Grooming: Sitting at sink Upper Body Bathing: Not assessed Lower Body Bathing: Not assessed Upper Body Dressing: Minimal assistance Where Assessed-Upper Body Dressing: Sitting at sink Lower Body Dressing: Moderate assistance Where Assessed-Lower Body Dressing: Standing at sink,Sitting at sink Toileting: Maximal assistance Where Assessed-Toileting: Bed level (urinal) Toilet Transfer: Not assessed Tub/Shower Transfer: Not assessed Vision   Perception    Praxis   Exercises:   Other Treatments:     Therapy/Group: Individual Therapy  Tonny Branch 09/13/2020, 6:49 AM

## 2020-09-13 NOTE — Progress Notes (Signed)
Speech Language Pathology TBI Note  Patient Details  Name: Steve Mejia MRN: [id number] Date of Birth: 2001/04/29  Today's Date: 09/13/2020 SLP Individual Time: 4496-7591 SLP Individual Time Calculation (min): 45 min  Short Term Goals: Week 2: SLP Short Term Goal 1 (Week 2): Patient will consume current diet with minimal overt s/s of aspiration with Mod I for use of swallowing compensatory strategies. SLP Short Term Goal 2 (Week 2): Patient will utilize external aids for orientation to time, place and situation with suprevision level verbal and visual cues. SLP Short Term Goal 3 (Week 2): Patient will demonstrate selective attention to tasks in a mildly dsitracting enviornment for 30 minutes with Min verbal cues for redirection. SLP Short Term Goal 4 (Week 2): Patient will self-monitor and correct errors during functional tsaks with Min verbal cues. SLP Short Term Goal 5 (Week 2): Patient will demonstrate functional problem solving for basic and familiar tasks with Mod verbal cues. SLP Short Term Goal 6 (Week 2): Patient will reduce impulsivity and will count to 3 aloud prior to initiating movement with Max verbal cues.  Skilled Therapeutic Interventions:Skilled ST services focused on cognitive skills. Pt was orientated with supervision A verbal cues for use of calendar for day of the week only. SLP facilitated basic problem solving and error awareness skills in 3-4 picture card sequencing tasks, pt required supervision A verbal cues fading to mod I with ability to self-correct errors.Pt was left in room with call bell within reach and bed alarm set. SLP recommends to continue skilled services.     Pain Pain Assessment Pain Score: 0-No pain  Agitated Behavior Scale: TBI Observation Details Observation Environment: CIR Start of observation period - Date: 09/13/20 Start of observation period - Time: 0900 End of observation period - Date: 09/13/20 End of observation period - Time:  0945 Agitated Behavior Scale (DO NOT LEAVE BLANKS) Short attention span, easy distractibility, inability to concentrate: Present to a slight degree Impulsive, impatient, low tolerance for pain or frustration: Absent Uncooperative, resistant to care, demanding: Absent Violent and/or threatening violence toward people or property: Absent Explosive and/or unpredictable anger: Absent Rocking, rubbing, moaning, or other self-stimulating behavior: Absent Pulling at tubes, restraints, etc.: Absent Wandering from treatment areas: Absent Restlessness, pacing, excessive movement: Absent Repetitive behaviors, motor, and/or verbal: Absent Rapid, loud, or excessive talking: Absent Sudden changes of mood: Absent Easily initiated or excessive crying and/or laughter: Absent Self-abusiveness, physical and/or verbal: Absent Agitated behavior scale total score: 15  Therapy/Group: Individual Therapy  Simya Tercero  [facility] 09/13/2020, 12:47 PM

## 2020-09-13 NOTE — Progress Notes (Signed)
Physical Therapy Session Note  Patient Details  Name: Steve Mejia MRN: [id number] Date of Birth: 26-Sep-2000  Today's Date: 09/13/2020 PT Individual Time: 1300-1340 PT Individual Time Calculation (min): 40 min   Short Term Goals:  Week 2:  PT Short Term Goal 1 (Week 2): Pt will perform bed to chair transfer with CGA. PT Short Term Goal 2 (Week 2): Pt will ambulate x200' with CGA. PT Short Term Goal 3 (Week 2): Pt will not attempt to stand from chair prior to being cued, demonstrating improvement in impulsivity. Week 3:     Skilled Therapeutic Interventions/Progress Updates:   Pt received in bed and agreeable to PT, and eating lunch. Pt provided supervision assist for meal consumption with cues for safe swallow strategies. Pt performed ambulatory transfer to [facility] with min assist. Pt transported to day room in [facility]. Standing balance and seatred balance training with Wii resort for basketball and sword play. Pt then requesting to return to bed because it is " nap time". Pt transported to room and requested to use restroom. Ambulatory transfer to toilet to void bladder. Performed hand hygiene at sink with min assist to prevent LOB. Pt returned to room and performed ambulatory transfer to bed with min assist. Sit>supine completed without assist, and left supine in bed with call bell in reach and all needs met.       Therapy Documentation Precautions:  Precautions Precautions: Fall,Other (comment),Sternal Precaution Comments: TBI, sternal, NG tube and full liquid diet Restrictions Weight Bearing Restrictions: No Other Position/Activity Restrictions: Sternal Precautions General: PT Amount of Missed Time (min): 20 Minutes PT Missed Treatment Reason: Patient fatigue Vital Signs: Therapy Vitals Temp: 98.4 F (36.9 C) Temp Source: Oral Pulse Rate: 93 Resp: 17 BP: 111/73 Patient Position (if appropriate): Lying Oxygen Therapy SpO2: 100 % O2 Device: Room Air Pain: Pain Assessment Pain  Score: 0-No pain    Therapy/Group: Individual Therapy  Lorie Phenix 09/13/2020, 4:41 PM
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
## 2020-09-14 MED ORDER — OXYCODONE HCL 5 MG/5ML PO SOLN
5.0000 mg | Freq: Four times a day (QID) | ORAL | Status: DC | PRN
  Administered 2020-09-14 – 2020-09-16 (×3): 5 mg via ORAL
  Administered 2020-09-16: 10 mg via ORAL
  Administered 2020-09-17: 5 mg via ORAL
  Administered 2020-09-18 (×2): 10 mg via ORAL
  Administered 2020-09-19: 5 mg via ORAL
  Administered 2020-09-19 – 2020-09-30 (×9): 10 mg via ORAL
  Filled 2020-09-14 (×2): qty 10
  Filled 2020-09-14: qty 5
  Filled 2020-09-14: qty 10
  Filled 2020-09-14 (×2): qty 5
  Filled 2020-09-14 (×5): qty 10
  Filled 2020-09-14: qty 5
  Filled 2020-09-14 (×5): qty 10
  Filled 2020-09-14: qty 5
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
## 2020-09-15 LAB — CBC
HCT: 39.1 % (ref 39.0–52.0)
Hemoglobin: 12.5 g/dL — ABNORMAL LOW (ref 13.0–17.0)
MCH: 27.8 pg (ref 26.0–34.0)
MCHC: 32 g/dL (ref 30.0–36.0)
MCV: 87.1 fL (ref 80.0–100.0)
Platelets: 471 10*3/uL — ABNORMAL HIGH (ref 150–400)
RBC: 4.49 MIL/uL (ref 4.22–5.81)
RDW: 12.8 % (ref 11.5–15.5)
WBC: 5.2 10*3/uL (ref 4.0–10.5)
nRBC: 0 % (ref 0.0–0.2)

## 2020-09-15 LAB — BASIC METABOLIC PANEL
Anion gap: 6 (ref 5–15)
BUN: 7 mg/dL (ref 6–20)
CO2: 34 mmol/L — ABNORMAL HIGH (ref 22–32)
Calcium: 9.8 mg/dL (ref 8.9–10.3)
Chloride: 101 mmol/L (ref 98–111)
Creatinine, Ser: 1.12 mg/dL (ref 0.61–1.24)
GFR, Estimated: >60 mL/min (ref >60)
Glucose, Bld: 90 mg/dL (ref 70–99)
Potassium: 4.3 mmol/L (ref 3.5–5.1)
Sodium: 141 mmol/L (ref 135–145)

## 2020-09-15 MED ORDER — BOOST / RESOURCE BREEZE PO LIQD CUSTOM
1.0000 | Freq: Three times a day (TID) | ORAL | Status: DC
  Administered 2020-09-15 – 2020-10-01 (×37): 1 via ORAL

## 2020-09-15 NOTE — Progress Notes (Signed)
Patient ID: Steve Mejia, male   DOB: 07-12-2000, 20 y.o.   MRN: [id number]  SW received updates from [facility] reporting that they are unable to move forward with Medicaid application due to [place] being attached to pt account.   SW spoke with pt mother Steve Mejia [phone]) to inform on above. She would like Medicaid application and SSA Kit so she can move forward with applying for pt. SW left packet in pt room for her to pick up.                                                                                 Loralee Pacas, MSW, [place] Office: [phone] Cell: [phone] Fax: [phone]
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
## 2020-09-15 NOTE — Progress Notes (Signed)
Occupational Therapy Session Note  Patient Details  Name: Steve Mejia MRN: [id number] Date of Birth: [birth year]  Today's Date: 09/15/2020  Session 1 OT Individual Time: 1003-1100 OT Individual Time Calculation (min): 57 min   Session 2 OT Individual Time: 1235-1305 OT Individual Time Calculation (min): 30 min    Short Term Goals: Week 2:  OT Short Term Goal 1 (Week 2): Patient will complete bathing tasks with mod verbal cues for safety OT Short Term Goal 2 (Week 2): Patient will complete 2 steps of toileting task with min A OT Short Term Goal 3 (Week 2): Patient will maintain attention to self feeding task with min cues  Skilled Therapeutic Interventions/Progress Updates:  Session 1   Pt greeted semi-reclined in enclosure bed and agreeable to OT treatment session. Pt declined any bathing/dressing this am and stated he did not need to go to the bathroom. Pt ambulated in room to TIS wc with min A> pt brought down to therapy gym and utilizd BITS to work on [employer] Educated on strategies to help with memory while also working on standing balance/endurance. Pt able to get 5 words with 65% accuracy, progressing to 70% accuracy with practice and strategies. Scavengar hunt ambulating in gym with min A and pt able to locate 6 numbers. Practiced dula task activity and visual scanning in hallway playing I-spy game. Pt rteurned to room and left semi-reclined in enclosure bed with needs met.     Session 2 Pt greeted seated EOB eating lunch with handoff to OT from nurse tech. Pt ate his lunch from all liquid diet with supervision and verbal cues to maintain attention to task. Had pt read out itemized receipt and locate food items on tray. Pt ambulated to the bathroom with min A and was able to stand to urinate with min A for balance. OT wrote in memory notebook and pt left semi-reclined in enclosure bed with needs met.  Therapy Documentation Precautions:  Precautions Precautions: Fall,Other  (comment),Sternal Precaution Comments: TBI, sternal, NG tube and full liquid diet Restrictions Weight Bearing Restrictions: No Other Position/Activity Restrictions: Sternal Precautions Pain:   denies pain  Therapy/Group: Individual Therapy  Valma Cava 09/15/2020, 1:09 PM
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
## 2020-09-15 NOTE — Progress Notes (Deleted)
Pt arrived to unit via bed, pt is alert and oriented, able to make needs known, no c/o pain, rash to back, 2 small open areas to buttocks poss abrasions to right cheek. Oriented to rehab.  
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
## 2020-09-16 NOTE — Progress Notes (Signed)
Occupational Therapy TBI Note  Patient Details  Name: Steve Mejia MRN: [id number] Date of Birth: [birth year]  Today's Date: 09/16/2020 OT Individual Time: 1002-1030 OT Individual Time Calculation (min): 28 min    Short Term Goals: Week 2:  OT Short Term Goal 1 (Week 2): Patient will complete bathing tasks with mod verbal cues for safety OT Short Term Goal 2 (Week 2): Patient will complete 2 steps of toileting task with min A OT Short Term Goal 3 (Week 2): Patient will maintain attention to self feeding task with min cues  Skilled Therapeutic Interventions/Progress Updates:    Pt greeted semi-reclined in enclosure bed with blankets covering head. Pt reports 10/10 headache to L side of head. Pt had already been given Tylenol with no relief. Pt requesting Oxycodone and nursing notified. Pt agreeable to get up to with with min A. Pt continued to report high pain in head "like someone is playing soccer in the L side of my brain." Worked on B UE coordination with towel folding task. Reviewed sternal precautions and practiced reaching up in "tube" for gentle stretching. Pt reported light sensitivity, so blinds closed and pt returned to bed with min A. Pt left in enclosure bed with needs met.   Therapy Documentation Precautions:  Precautions Precautions: Fall,Other (comment),Sternal Precaution Comments: TBI, sternal, NG tube and full liquid diet Restrictions Weight Bearing Restrictions: No Other Position/Activity Restrictions: Sternal Precautions Pain: 10/10 headache, nursing administered pain meds Agitated Behavior Scale: TBI Observation Details Observation Environment: pt room Start of observation period - Date: 09/16/20 Start of observation period - Time: 1300 End of observation period - Date: 09/16/20 End of observation period - Time: 1345 Agitated Behavior Scale (DO NOT LEAVE BLANKS) Short attention span, easy distractibility, inability to concentrate: Present to a slight  degree Impulsive, impatient, low tolerance for pain or frustration: Absent Uncooperative, resistant to care, demanding: Absent Violent and/or threatening violence toward people or property: Absent Explosive and/or unpredictable anger: Absent Rocking, rubbing, moaning, or other self-stimulating behavior: Absent Pulling at tubes, restraints, etc.: Absent Wandering from treatment areas: Absent Restlessness, pacing, excessive movement: Absent Repetitive behaviors, motor, and/or verbal: Present to a slight degree Rapid, loud, or excessive talking: Absent Sudden changes of mood: Absent Easily initiated or excessive crying and/or laughter: Absent Self-abusiveness, physical and/or verbal: Absent Agitated behavior scale total score: 16    Therapy/Group: Individual Therapy  Valma Cava 09/16/2020, 1:52 PM

## 2020-09-16 NOTE — Progress Notes (Addendum)
Patient ID: Steve Mejia, male   DOB: 2000/09/09, 20 y.o.   MRN: [id number]  SW left message for pt mother [name] [phone]) to provide updates from team conference, and d/c date 5/18. SW will continue to follow pt for d/c needs.   *SW spoke with pt mother providing updates. She asked if there was any way to help him get out of the bed or outside of the room . SW informed once he gets out of the enclosure bed, he will likely have more options. She understands our goal is working towards getting out of the enclosure bed. SW to follow-up after team conference next week.   Cecile Sheerer, MSW, LCSWA Office: [phone] Cell: [phone] Fax: [phone]
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
## 2020-09-16 NOTE — Progress Notes (Incomplete)
Physical Therapy Session Note  Patient Details  Name: Steve Mejia MRN: [id number] Date of Birth: 09-13-2000  {CH[facility] IP REHAB PT TIME CALCULATION:[id number]}  Short Term Goals: Week 2:  PT Short Term Goal 1 (Week 2): Pt will perform bed to chair transfer with CGA. PT Short Term Goal 2 (Week 2): Pt will ambulate x200' with CGA. PT Short Term Goal 3 (Week 2): Pt will not attempt to stand from chair prior to being cued, demonstrating improvement in impulsivity.  Skilled Therapeutic Interventions/Progress Updates:      Pt received supine in bed asleep. Very drowsy after having received oxycodone for headache prior to session. Pt reports slight headache but improving throughout session. PT provides rest breaks as needed to manage pain. Pt performs bed mobility independently. Sit to stand with minA. Pt immediately loses balance upon standing and requires minA/modA to maintain balance. Stand pivot transfer to [facility] with minA. WC transport to gym for time management. PT retrieves L foot rest and sizes correctly for pt body mechanics. Pt ambulates x300' with minA. Initially pt loses balance several times, but stability improves with increased time on feet. PT cues for upright gaze to improve posture and balance and increasing gait speed to decrease risk for falls. Pt performs NMR for standing balance and activity tolerance with Wii bowling. Multiple reps of sit to stand and performing dynamic, large amplitude movements, with PT providing minA. Pt requires frequent seated rest breaks. Pt ambulates additional 300' with minA. Cued to perform cognitive task but has difficulty multitasking, quickly losing focus. Pt left supine in posey bed with call bell intact.  Therapy Documentation Precautions:  Precautions Precautions: Fall,Other (comment),Sternal Precaution Comments: TBI, sternal, NG tube and full liquid diet Restrictions Weight Bearing Restrictions: No Other Position/Activity Restrictions: Sternal  Precautions   Therapy/Group: Individual Therapy  Beau Fanny, PT, DPT 09/16/2020, 12:48 PM
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
## 2020-09-17 NOTE — Progress Notes (Signed)
Received call from [place], Steve Mejia blood pressure 91/60, we will continue to monitor. Cancel order for IV access.

## 2020-09-17 NOTE — Progress Notes (Signed)
Occupational Therapy Session Note  Patient Details  Name: Steve Mejia MRN: [id number] Date of Birth: [birth year]  Today's Date: 09/17/2020 OT Individual Time: 1405-1500 OT Individual Time Calculation (min): 55 min    Short Term Goals: Week 1:  OT Short Term Goal 1 (Week 1): Pt will complete toilet transfer with 1 assist and LRAD to progress with OOB toileting OT Short Term Goal 1 - Progress (Week 1): Met OT Short Term Goal 2 (Week 1): Pt will complete sit<stand with Min A during LB dressing during 2 consecutive OT sessions OT Short Term Goal 2 - Progress (Week 1): Met OT Short Term Goal 3 (Week 1): Pt will complete 1 grooming tasks at the sink with min cues for attention, sequencing, and awareness OT Short Term Goal 3 - Progress (Week 1): Met  Skilled Therapeutic Interventions/Progress Updates:     1:1. Pt received in bed agreeable to OT. Pt completes alternating attention task :card matching, lego building based on picture and spelling name out 1 letter at a time then moving on to the word apple. Pt requires min cuing at first to complete 1 step of each activity before moving onto next task. Pt able to complete all spelling of words retaining where pt was in the letter without error. When cued pt to reverse order, pt unable to recall order of steps without max cuing. Overall for all mobility throughout session pt requires min- MOD A for increased A when scissoring LES or lateral LOB L today. Pt boxes in standing requiring up to MOD cuing to recall sequence of patterns of jabs/cross/uppercut/side punch. Pt able to sit at [facility] and complete trailmaking A test in 1 min 9 seconds and with 1 error (improved from 5 errors/18min 30 sec) and able to recall sequence in order up to 6 words. Exited session with pt seated in bed zipped up, exit alarm on and call light in reach   Therapy Documentation Precautions:  Precautions Precautions: Fall,Other (comment),Sternal Precaution Comments: TBI, sternal,  NG tube and full liquid diet Restrictions Weight Bearing Restrictions: No Other Position/Activity Restrictions: Sternal Precautions General:   Vital Signs: Therapy Vitals Temp: 98 F (36.7 C) Pulse Rate: 68 Resp: 18 BP: 105/73 Patient Position (if appropriate): Lying Oxygen Therapy SpO2: 100 % O2 Device: Room Air Pain:   ADL: ADL Eating: Not assessed Grooming: Minimal assistance Where Assessed-Grooming: Sitting at sink Upper Body Bathing: Not assessed Lower Body Bathing: Not assessed Upper Body Dressing: Minimal assistance Where Assessed-Upper Body Dressing: Sitting at sink Lower Body Dressing: Moderate assistance Where Assessed-Lower Body Dressing: Standing at sink,Sitting at sink Toileting: Maximal assistance Where Assessed-Toileting: Bed level (urinal) Toilet Transfer: Not assessed Tub/Shower Transfer: Not assessed Vision   Perception    Praxis   Exercises:   Other Treatments:     Therapy/Group: Individual Therapy  Tonny Branch 09/17/2020, 6:57 AM

## 2020-09-17 NOTE — Plan of Care (Signed)
  Problem: Safety: Goal: Non-violent Restraint(s) Outcome: Progressing   Problem: Consults Goal: RH BRAIN INJURY PATIENT EDUCATION Description: Description: See Patient Education module for eduction specifics Outcome: Progressing Goal: Skin Care Protocol Initiated - if Braden Score 18 or less Description: If consults are not indicated, leave blank or document N/A Outcome: Progressing   Problem: RH BOWEL ELIMINATION Goal: RH STG MANAGE BOWEL WITH ASSISTANCE Description: STG Manage Bowel with Supervision Assistance. Outcome: Progressing Goal: RH STG MANAGE BOWEL W/MEDICATION W/ASSISTANCE Description: STG Manage Bowel with Medication with Supervision Assistance. Outcome: Progressing   Problem: RH BLADDER ELIMINATION Goal: RH STG MANAGE BLADDER WITH ASSISTANCE Description: STG Manage Bladder With Supervision Assistance Outcome: Progressing Goal: RH STG MANAGE BLADDER WITH MEDICATION WITH ASSISTANCE Description: STG Manage Bladder With Medication With Supervision Assistance. Outcome: Progressing   Problem: RH SKIN INTEGRITY Goal: RH STG ABLE TO PERFORM INCISION/WOUND CARE W/ASSISTANCE Description: STG Able To Perform Incision/Wound Care With Supervision Assistance. Outcome: Progressing   Problem: RH SAFETY Goal: RH STG ADHERE TO SAFETY PRECAUTIONS W/ASSISTANCE/DEVICE Description: STG Adhere to Safety Precautions With Supervision Assistance/Device. Outcome: Progressing Goal: RH STG DECREASED RISK OF FALL WITH ASSISTANCE Description: STG Decreased Risk of Fall With Assistance. Outcome: Progressing   Problem: RH COGNITION-NURSING Goal: RH STG USES MEMORY AIDS/STRATEGIES W/ASSIST TO PROBLEM SOLVE Description: STG Uses Memory Aids/Strategies With Cues and Reminders to Problem Solve. Outcome: Progressing Goal: RH STG ANTICIPATES NEEDS/CALLS FOR ASSIST W/ASSIST/CUES Description: STG Anticipates Needs/Calls for Assist With Cues and Reminders. Outcome: Progressing   Problem: RH  PAIN MANAGEMENT Goal: RH STG PAIN MANAGED AT OR BELOW PT'S PAIN GOAL Description: <4 on a 0-10 pain scale. Outcome: Progressing   Problem: RH KNOWLEDGE DEFICIT BRAIN INJURY Goal: RH STG INCREASE KNOWLEDGE OF SELF CARE AFTER BRAIN INJURY Description: Patient will demonstrate knowledge of medication management, dietary management, weight bearing precautions, skin/wound care with educational materials and handouts provided by staff, at discharge independently. Outcome: Progressing   

## 2020-09-17 NOTE — Progress Notes (Signed)
Physical Therapy TBI Note  Patient Details  Name: Steve Mejia MRN: [id number] Date of Birth: [birth year]  Today's Date: 09/17/2020 PT Individual Time: 3545-6256 PT Individual Time Calculation (min): 72 min   Short Term Goals: Week 2:  PT Short Term Goal 1 (Week 2): Pt will perform bed to chair transfer with CGA. PT Short Term Goal 2 (Week 2): Pt will ambulate x200' with CGA. PT Short Term Goal 3 (Week 2): Pt will not attempt to stand from chair prior to being cued, demonstrating improvement in impulsivity.  Skilled Therapeutic Interventions/Progress Updates:     Pt received supine in posey bed, appearing somewhat agitated and requesting to leave hospital because he is hungry and "we won't let [him] eat". PT agrees to assist pt in mobilizing out of bed if pt agrees to return to [place] bed following session. Pt agreeable. Also complains of pain in jaw from wires. Bed mobility with supervision and cues for positioning. Stand pivot transfer to [facility] with minA and no AD. Much of session is focused on pt education on progression of brain injury, including safety considerations. PT explains that pt's frustration with confinement and being unable to eat is indicative of healing as pt is becoming more aware of situation and limitations. Pt also speaks briefly with speech therapy regarding diet, as pt is perseverative on wanting to eat something with "flavor". Pt eventually able to calm down with education form PT and ST, and verbalizes understanding about imposed restrictions. Pt stands to shoot baskeball in gym to work on coordination, balance, and adherence to task. Pt requires minA for safety at all times and moves impulsively, requiring redirection for safety. Pt fatigues after several minutes and requires seated rest break. Pt again becomes perseverative on wanting to leave, but is able to be redirected with possibility of going outside. Pt transported via WC outside. While seated pt becomes drowsy.  Transported back inside. Stand pivot back to bed with minA. Left supine in posey bed with call bell within reach.  Therapy Documentation Precautions:  Precautions Precautions: Fall,Other (comment),Sternal Precaution Comments: TBI, sternal, NG tube and full liquid diet Restrictions Weight Bearing Restrictions: No Other Position/Activity Restrictions: Sternal Precautions Agitated Behavior Scale: TBI Observation Details Observation Environment: pt room Start of observation period - Date: 09/16/20 Start of observation period - Time: 1300 End of observation period - Date: 09/16/20 End of observation period - Time: 1400 Agitated Behavior Scale (DO NOT LEAVE BLANKS) Short attention span, easy distractibility, inability to concentrate: Present to a slight degree Impulsive, impatient, low tolerance for pain or frustration: Absent Uncooperative, resistant to care, demanding: Absent Violent and/or threatening violence toward people or property: Absent Explosive and/or unpredictable anger: Absent Rocking, rubbing, moaning, or other self-stimulating behavior: Absent Pulling at tubes, restraints, etc.: Absent Wandering from treatment areas: Absent Restlessness, pacing, excessive movement: Absent Repetitive behaviors, motor, and/or verbal: Present to a slight degree Rapid, loud, or excessive talking: Absent Sudden changes of mood: Absent Easily initiated or excessive crying and/or laughter: Absent Self-abusiveness, physical and/or verbal: Absent Agitated behavior scale total score: 16    Therapy/Group: Individual Therapy  Beau Fanny, PT, DPT 09/17/2020, 12:52 PM

## 2020-09-17 NOTE — Progress Notes (Signed)
Reviewed consult for IV. Noted no IV medications ordered at present. Secure chat sent to RN to further assess access needs.

## 2020-09-17 NOTE — Progress Notes (Signed)
Per ST--patient has been requesting change in diet daily. Called Dr. Joie Bimler office regarding patient's request re: some relaxation of diet. He was cleared to start pureed diet.
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
## 2020-09-17 NOTE — Progress Notes (Signed)
Pt mother Kayleen Memos updated regarding current blood pressure. PO fluids encouraged patient remains stable.
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
## 2020-09-17 NOTE — Progress Notes (Signed)
Received call from nurse reporting Steve Mejia blood pressure was 85/60 manually. Will re-assess at 21:00. Order given for IV Access in case he needs IV bolus. Awaiting a return call from [place] with [employer].

## 2020-09-17 NOTE — Progress Notes (Signed)
NP on call updated about BP bp is fine will encourage PO intake. Ilean Skill LPN
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
## 2020-09-18 NOTE — Progress Notes (Signed)
Speech Language Pathology TBI Note  Patient Details  Name: Steve Mejia MRN: [id number] Date of Birth: 03/11/2001  Today's Date: 09/18/2020 SLP Individual Time: 0650-0730 SLP Individual Time Calculation (min): 40 min  Short Term Goals: Week 2: SLP Short Term Goal 1 (Week 2): Patient will consume current diet with minimal overt s/s of aspiration with Mod I for use of swallowing compensatory strategies. SLP Short Term Goal 2 (Week 2): Patient will utilize external aids for orientation to time, place and situation with suprevision level verbal and visual cues. SLP Short Term Goal 3 (Week 2): Patient will demonstrate selective attention to tasks in a mildly dsitracting enviornment for 30 minutes with Min verbal cues for redirection. SLP Short Term Goal 4 (Week 2): Patient will self-monitor and correct errors during functional tsaks with Min verbal cues. SLP Short Term Goal 5 (Week 2): Patient will demonstrate functional problem solving for basic and familiar tasks with Mod verbal cues. SLP Short Term Goal 6 (Week 2): Patient will reduce impulsivity and will count to 3 aloud prior to initiating movement with Max verbal cues.  Skilled Therapeutic Interventions: Skilled treatment session focused on dysphagia and cognitive goals. Upon arrival, patient was awake and requesting food. Patient transferred to the chair with extra time and overall contact guard. Patient was verbose and perseverative on consistent topics of conversastion requiring Max verbal cues to alternate attention between self-feeding and conversation. Patient consumed Dys. 1 textures with thin liquids without overt s/s of aspiration but recommend ongoing full supervision. Patient continues to demonstrate poor insight regarding need for current diet and requires frequent education. Patient transferred back to enclosure bed at end of session and left with all needs within reach. Continue with current plan of care.      Pain Pain  Assessment Pain Scale: 0-10 Pain Score: 0-No pain Faces Pain Scale: No hurt  Agitated Behavior Scale: TBI Observation Details Observation Environment: Patient's room Start of observation period - Date: 09/18/20 Start of observation period - Time: 0650 End of observation period - Date: 09/18/20 End of observation period - Time: 0730 Agitated Behavior Scale (DO NOT LEAVE BLANKS) Short attention span, easy distractibility, inability to concentrate: Present to an extreme degree Impulsive, impatient, low tolerance for pain or frustration: Present to a slight degree Uncooperative, resistant to care, demanding: Absent Violent and/or threatening violence toward people or property: Absent Explosive and/or unpredictable anger: Absent Rocking, rubbing, moaning, or other self-stimulating behavior: Absent Pulling at tubes, restraints, etc.: Absent Wandering from treatment areas: Absent Restlessness, pacing, excessive movement: Absent Repetitive behaviors, motor, and/or verbal: Present to an extreme degree Rapid, loud, or excessive talking: Present to an extreme degree Sudden changes of mood: Absent Easily initiated or excessive crying and/or laughter: Absent Self-abusiveness, physical and/or verbal: Absent Agitated behavior scale total score: 24  Therapy/Group: Individual Therapy  Iyonnah Ferrante 09/18/2020, 1:19 PM

## 2020-09-18 NOTE — Progress Notes (Signed)
Occupational Therapy Session Note  Patient Details  Name: Steve Mejia MRN: [id number] Date of Birth: [birth year]  Today's Date: 09/18/2020 OT Co-Treatment Time: 1115-1200 OT Co-Treatment Time Calculation (min): 45 min   Short Term Goals: Week 1:  OT Short Term Goal 1 (Week 1): Pt will complete toilet transfer with 1 assist and LRAD to progress with OOB toileting OT Short Term Goal 1 - Progress (Week 1): Met OT Short Term Goal 2 (Week 1): Pt will complete sit<stand with Min A during LB dressing during 2 consecutive OT sessions OT Short Term Goal 2 - Progress (Week 1): Met OT Short Term Goal 3 (Week 1): Pt will complete 1 grooming tasks at the sink with min cues for attention, sequencing, and awareness OT Short Term Goal 3 - Progress (Week 1): Met Week 2:  OT Short Term Goal 1 (Week 2): Patient will complete bathing tasks with mod verbal cues for safety OT Short Term Goal 2 (Week 2): Patient will complete 2 steps of toileting task with min A OT Short Term Goal 3 (Week 2): Patient will maintain attention to self feeding task with min cues   Skilled Therapeutic Interventions/Progress Updates:    Pt greeted at time of session in Posey bed with NT present providing full Supervision for self feeding and hand off to OT. PT joining for co treat at this time. Pt c/o pain in L side of face, RN present and performing med pass prior to session. Supine > sit climbing out of bed CGA and ambulated room <> gym Min A for ataxia but no LOB noted. BITS 2 rounds of visual tracking/GMC for hitting objects and STM for pattern recognition and copying image with CGA for standing balance throughout. Dynamic standing at mat ball toss standing approx 10 minute intervals with CGA, reaching out of BOS and in varying directions with throwing ball within sternal precautions, also with wide and narrow BOS to improve standing balance. When ambulating back to room, pt saying he needed to "pop his knees" and proceeded to go  into extremely low squat with CGA  and no LOB before walking to his room, transferring into Posey bed with CGA. Pt left with call bell in reach. Note Mod cues needed throughout session for safety.   Therapy Documentation Precautions:  Precautions Precautions: Fall,Other (comment),Sternal Precaution Comments: TBI, sternal, NG tube and full liquid diet Restrictions Weight Bearing Restrictions: No Other Position/Activity Restrictions: Sternal Precautions     Therapy/Group: Co-Treatment  Viona Gilmore 09/18/2020, 7:26 AM

## 2020-09-18 NOTE — Progress Notes (Signed)
Occupational Therapy TBI Note  Patient Details  Name: Steve Mejia MRN: [id number] Date of Birth: 09/13/2000  Today's Date: 09/18/2020 OT Individual Time: 0830-0927 OT Individual Time Calculation (min): 57 min   Short Term Goals: Week 2:  OT Short Term Goal 1 (Week 2): Patient will complete bathing tasks with mod verbal cues for safety OT Short Term Goal 2 (Week 2): Patient will complete 2 steps of toileting task with min A OT Short Term Goal 3 (Week 2): Patient will maintain attention to self feeding task with min cues  Skilled Therapeutic Interventions/Progress Updates:    Pt greeted semi-reclined in enclosure bed and agreeable to shower today. Pt ambulated around room to collect clothing with min/CGA and less gait perturbations today. Pt with a few minor LOBs, but overall, he was able to self-correct those with only CGA. Educated provided and cues throughout for safety awareness. Pt stood to urinate with CGA, then maintained grasp on grab bar while stepping out of clothing. Bathing completed sit<>stand from tub bench with CGA and verbal cues for safety. After shower, dressing completed from sitting EOB. Pt reported some chest discomfort and stated he felt short of breath. OT checked vitals and informed nursing. Per nursing, pt had not received morning meds including medication for heart rhythm. PA also notified and OT left his next therapist know to keep an eye on pts heartrate during activity. Pt returned to enclosure bed and left semi-reclined in bed with needs met.  Seated EOB   HR:106  BP 122/76 Standing for 2 minutes  HR: 140  BP 114/75   Therapy Documentation Precautions:  Precautions Precautions: Fall,Other (comment),Sternal Precaution Comments: TBI, sternal, NG tube and full liquid diet Restrictions Weight Bearing Restrictions: No Other Position/Activity Restrictions: Sternal Precautions General:  Pain:  Pt reports some pain in chest at incision site. Nursing  notified. Agitated Behavior Scale: TBI Observation Details Observation Environment: Room Start of observation period - Date: 09/18/20 Start of observation period - Time: 0830 End of observation period - Date: 02/13/21 End of observation period - Time: 0935 Agitated Behavior Scale (DO NOT LEAVE BLANKS) Short attention span, easy distractibility, inability to concentrate: Present to a slight degree Impulsive, impatient, low tolerance for pain or frustration: Present to a slight degree Uncooperative, resistant to care, demanding: Absent Violent and/or threatening violence toward people or property: Absent Explosive and/or unpredictable anger: Absent Rocking, rubbing, moaning, or other self-stimulating behavior: Absent Pulling at tubes, restraints, etc.: Absent Wandering from treatment areas: Absent Restlessness, pacing, excessive movement: Absent Repetitive behaviors, motor, and/or verbal: Present to a slight degree Rapid, loud, or excessive talking: Absent Sudden changes of mood: Absent Easily initiated or excessive crying and/or laughter: Absent Self-abusiveness, physical and/or verbal: Absent Agitated behavior scale total score: 17   Therapy/Group: Individual Therapy   S  09/18/2020, 9:43 AM  

## 2020-09-18 NOTE — Progress Notes (Signed)
Physical Therapy TBI Note  Patient Details  Name: Steve Mejia MRN: [id number] Date of Birth: 2000/09/25  Today's Date: 09/18/2020 PT Co-Treatment Time: 1115-1200 PT Co-Treatment Time Calculation (min): 45 min  Short Term Goals: Week 2:  PT Short Term Goal 1 (Week 2): Pt will perform bed to chair transfer with CGA. PT Short Term Goal 2 (Week 2): Pt will ambulate x200' with CGA. PT Short Term Goal 3 (Week 2): Pt will not attempt to stand from chair prior to being cued, demonstrating improvement in impulsivity.  Skilled Therapeutic Interventions/Progress Updates:    Pt received sitting EOB in canopy bed, OT present for co-tx. PT focused on dynamic standing balance, functional gait training, pt education. Pt c/o facial pain on arrival, unrated, but quite perseverative on this at start of session. RN arriving shortly for medications and pt redirectable.   Sit<>stand with CGA from EOB and ambulated with light minA and no AD from his room to ortho gym, ~238ft. Gait deficits include lateral unsteadiness, narrow BOS with delayed protective responses.   Completed BITS system with Geoboard duplication task with 10 sec delay for memory component - pt able to achieve mild complexity puzzles without cues but moderate complexity requires minA but appropriate error recognition. Pt c/o worsening HA/facial pain from the TV from BITS system, therefore, deferred further activities with it.   Completed dynamic standing balance with +2 assist for one person guarding patient while the other completed unweighted ball toss with both large ball and small ball. Completed with feet apart and feet together - requires minA for balance for both. HR assessed, reading mid 80's during prolonged standing. Pt maintained standing for >10 minutes while completing this. After brief seated rest break, he then completed soccer kicks with ball, again with +2 assist for guarding patient - working on stepping strategies, reaction time,  and single leg balance. Pt primarily used his R leg and had minor LOB's that required minA for correction.  Pt ambulated back to his room with light minA and no AD, ~272ft with similar deficits and cues as listed above. Of note, towards end of gait trial, pt reporting need to "pop" his knee - mildly impulsive with performing very low squat to the ground which he did with CGA and no LOB.   Pt ended session seated in canopy bed. Call bell within reach and posey bed enclosed.    Therapy Documentation Precautions:  Precautions Precautions: Fall,Other (comment),Sternal Precaution Comments: TBI, sternal, NG tube and full liquid diet Restrictions Weight Bearing Restrictions: No Other Position/Activity Restrictions: Sternal Precautions  Agitated Behavior Scale: TBI Observation Details Observation Environment: Room Start of observation period - Date: 09/18/20 Start of observation period - Time: 0830 End of observation period - Date: 02/13/21 End of observation period - Time: 0935 Agitated Behavior Scale (DO NOT LEAVE BLANKS) Short attention span, easy distractibility, inability to concentrate: Present to a slight degree Impulsive, impatient, low tolerance for pain or frustration: Present to a slight degree Uncooperative, resistant to care, demanding: Absent Violent and/or threatening violence toward people or property: Absent Explosive and/or unpredictable anger: Absent Rocking, rubbing, moaning, or other self-stimulating behavior: Absent Pulling at tubes, restraints, etc.: Absent Wandering from treatment areas: Absent Restlessness, pacing, excessive movement: Absent Repetitive behaviors, motor, and/or verbal: Present to a slight degree Rapid, loud, or excessive talking: Absent Sudden changes of mood: Absent Easily initiated or excessive crying and/or laughter: Absent Self-abusiveness, physical and/or verbal: Absent Agitated behavior scale total score: 17   Therapy/Group: Individual  Therapy  Sebastin Perlmutter P Shantai Tiedeman PT 09/18/2020, 12:22 PM
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
## 2020-09-18 NOTE — Progress Notes (Signed)
Physical Therapy TBI Note  Patient Details  Name: Steve Mejia MRN: [id number] Date of Birth: [birth year]  Today's Date: 09/18/2020 PT Individual Time: 1345-1411 and 1500-1525  PT Individual Time Calculation (min): 26 min and 25 min  Short Term Goals: Week 1:  PT Short Term Goal 1 (Week 1): Pt will ambulate 165f w/mod assist of 2 PT Short Term Goal 1 - Progress (Week 1): Met PT Short Term Goal 2 (Week 1): Pt will perform stand pivot transfer to wc w/mod assist of 1 PT Short Term Goal 2 - Progress (Week 1): Met PT Short Term Goal 3 (Week 1): Pt will stand statically w/bilat UE to single UE support and mod assist of 1 during functional task/activity PT Short Term Goal 3 - Progress (Week 1): Met PT Short Term Goal 4 (Week 1): Pt will sit on edge of bed/mat w/min assist for at least 1 min PT Short Term Goal 4 - Progress (Week 1): Met Week 2:  PT Short Term Goal 1 (Week 2): Pt will perform bed to chair transfer with CGA. PT Short Term Goal 2 (Week 2): Pt will ambulate x200' with CGA. PT Short Term Goal 3 (Week 2): Pt will not attempt to stand from chair prior to being cued, demonstrating improvement in impulsivity.  Skilled Therapeutic Interventions/Progress Updates:   Treatment Session 1 Received pt supine in enclosure bed asleep. Pt easily aroused and agreeable to PT session . Pt reported urge to use restroom and ambulated 159fwithout AD and CGA to bathroom. Pt able to void while standing but soiled floor. Pt wiped up urine from floor standing with CGA and stood at sink and washed hands with CGA and min cues to locate soap. Pt then ambulated 7571f 2 trials without AD and CGA to/from dayroom. Mild unsteadiness noted with narrow BOS when turning. Engaged in pegboard activity with emphasis on visual scanning, perceptual awareness, problem solving, and fine motor control re-creating pictures x 2 trials with min cues for sequencing. Returned to room and got back into enclosure bed. Concluded  session with pt sitting in enclosure bed with all needs within reach.   Treatment Session 2 Received pt long sitting in enclosure bed with NT providing supervision while pt ate soup; PT took over with care. Pt agreeable to PT treatment. Pt ambulated [id number] trials without AD and CGA to/from dayroom. Engaged in dynamic standing balance playing soccer with rehab tech and therapist with close supeBillingsor balance with emphasis on reaction time, stepping strategies, coordination, SLS, and endurance using RLE primarily. Pt reported being avid soccer player previously and attempting to perform tricks resulting in multiple LOB requiring mod A to correct. Educated pt on safety concerns and fall risk; pt with minimal recognition of balance deficits blaming balance deficits on hospital socks. Pt impulsive bending down into low squat position to "stretch his ankles" and demonstrated posterior LOB requiring min/mod A to correct. Pt reported his "ankles felt tight" therefore performed seated ankle stretches into all available planes of motion with supervision. Concluded session with pt supine in enclosure bed with all needs within reach.   Therapy Documentation Precautions:  Precautions Precautions: Fall,Other (comment),Sternal Precaution Comments: TBI, sternal, NG tube and full liquid diet Restrictions Weight Bearing Restrictions: No Other Position/Activity Restrictions: Sternal Precautions Agitated Behavior Scale: TBI Observation Details Observation Environment: patients room Start of observation period - Date: 09/18/20 Start of observation period - Time: [id number] of observation period - Date: 09/18/20 End of observation period -  Time: 1415 Agitated Behavior Scale (DO NOT LEAVE BLANKS) Short attention span, easy distractibility, inability to concentrate: Present to a moderate degree Impulsive, impatient, low tolerance for pain or frustration: Present to a slight degree Uncooperative, resistant  to care, demanding: Absent Violent and/or threatening violence toward people or property: Absent Explosive and/or unpredictable anger: Absent Rocking, rubbing, moaning, or other self-stimulating behavior: Absent Pulling at tubes, restraints, etc.: Absent Wandering from treatment areas: Absent Restlessness, pacing, excessive movement: Absent Repetitive behaviors, motor, and/or verbal: Present to a moderate degree Rapid, loud, or excessive talking: Present to a slight degree Sudden changes of mood: Absent Easily initiated or excessive crying and/or laughter: Absent Self-abusiveness, physical and/or verbal: Absent Agitated behavior scale total score: 20   Therapy/Group: Individual Therapy Alfonse Alpers PT, DPT   09/18/2020, 7:37 AM

## 2020-09-19 NOTE — Progress Notes (Signed)
Occupational Therapy Session Note  Patient Details  Name: Steve Mejia MRN: [id number] Date of Birth: 10/19/2000  Today's Date: 09/19/2020 OT Group Time: 1100-1200 OT Group Time Calculation (min): 60 min    Short Term Goals: Week 1:  OT Short Term Goal 1 (Week 1): Pt will complete toilet transfer with 1 assist and LRAD to progress with OOB toileting OT Short Term Goal 1 - Progress (Week 1): Met OT Short Term Goal 2 (Week 1): Pt will complete sit<stand with Min A during LB dressing during 2 consecutive OT sessions OT Short Term Goal 2 - Progress (Week 1): Met OT Short Term Goal 3 (Week 1): Pt will complete 1 grooming tasks at the sink with min cues for attention, sequencing, and awareness OT Short Term Goal 3 - Progress (Week 1): Met  Skilled Therapeutic Interventions/Progress Updates:    Pt participated in rhythmic drumming group. Pain not rated during session. Focus of group on BUE coordination, strengthening, endurance, timing/control, activity tolerance, and social participation and engagement. Pt performs session from seated position for energy conservation. Skilled interventions included graded movements down to "stay in the tube" and redirection of off topic conversation. Warm up performed prior to exercises and UB stretching completed at end of group with demo from OT. Pt able to select preferred song to share with group. Returned pt to room at end of session. Exited session with pt seated in bed, exit alarm on and call light in reach  Therapy Documentation Precautions:  Precautions Precautions: Fall,Other (comment),Sternal Precaution Comments: TBI, sternal, NG tube and full liquid diet Restrictions Weight Bearing Restrictions: No Other Position/Activity Restrictions: Sternal Precautions General:   Vital Signs:   Pain:   ADL: ADL Eating: Not assessed Grooming: Minimal assistance Where Assessed-Grooming: Sitting at sink Upper Body Bathing: Not assessed Lower Body  Bathing: Not assessed Upper Body Dressing: Minimal assistance Where Assessed-Upper Body Dressing: Sitting at sink Lower Body Dressing: Moderate assistance Where Assessed-Lower Body Dressing: Standing at sink,Sitting at sink Toileting: Maximal assistance Where Assessed-Toileting: Bed level (urinal) Toilet Transfer: Not assessed Tub/Shower Transfer: Not assessed Vision   Perception    Praxis   Exercises:   Other Treatments:     Therapy/Group: Group Therapy   M  09/19/2020, 12:22 PM  

## 2020-09-19 NOTE — Progress Notes (Signed)
Occupational Therapy TBI Note  Patient Details  Name: Steve Mejia MRN: [id number] Date of Birth: 08-18-2000  Today's Date: 09/19/2020 OT Individual Time: 2094-7096 OT Individual Time Calculation (min): 56 min    Short Term Goals: Week 2:  OT Short Term Goal 1 (Week 2): Patient will complete bathing tasks with mod verbal cues for safety OT Short Term Goal 2 (Week 2): Patient will complete 2 steps of toileting task with min A OT Short Term Goal 3 (Week 2): Patient will maintain attention to self feeding task with min cues  Skilled Therapeutic Interventions/Progress Updates:    Treatment session with focus on sustained and alternating attention and functional mobility.  Pt received supine in bed asleep, but easily aroused.  Pt reports fatigue but agreeable to therapy session. Pt ambulated to day room with CGA due to minimal gait perturbations with improving awareness and ability to correct.  Pt initially distracted by chest pain, reporting dull.  RN alerted and provided encouragement and reassurance that pt had already received morning meds.  Vitals WNL BP 119/76, HR 94.  Pt requested to return to room to don additional shirt as he was cold.  Pt donned shirt with setup and then returned to dayroom with CGA again.  Engaged in card activity in sitting and standing with focus on attention to task and recall of rules.  Pt demonstrating mild internal distraction with verbosity but able to continue to engage in task with minimal redirection. Pt appropriately engaged in table top task.  Pt returned to room and returned to semi-reclined in enclosure bed with all needs in reach.  Therapy Documentation Precautions:  Precautions Precautions: Fall,Other (comment),Sternal Precaution Comments: TBI, sternal, NG tube and full liquid diet Restrictions Weight Bearing Restrictions: No Other Position/Activity Restrictions: Sternal Precautions General:   Vital Signs: Therapy Vitals Temp: 97.7 F (36.5  C) Temp Source: Oral Pulse Rate: 76 Resp: 18 BP: 94/60 Patient Position (if appropriate): Lying Pain:  Pt with c/o dull pain in chest, RN notified.  Vitals WNL and pt premedicated. Agitated Behavior Scale: TBI Observation Details Observation Environment: patient's room/gym Start of observation period - Date: 09/19/20 Start of observation period - Time: 0836 End of observation period - Date: 09/19/20 End of observation period - Time: 0932 Agitated Behavior Scale (DO NOT LEAVE BLANKS) Short attention span, easy distractibility, inability to concentrate: Present to a moderate degree Impulsive, impatient, low tolerance for pain or frustration: Present to a slight degree Uncooperative, resistant to care, demanding: Absent Violent and/or threatening violence toward people or property: Absent Explosive and/or unpredictable anger: Absent Rocking, rubbing, moaning, or other self-stimulating behavior: Absent Pulling at tubes, restraints, etc.: Absent Wandering from treatment areas: Absent Restlessness, pacing, excessive movement: Absent Repetitive behaviors, motor, and/or verbal: Present to a moderate degree Rapid, loud, or excessive talking: Present to a moderate degree Sudden changes of mood: Absent Easily initiated or excessive crying and/or laughter: Absent Self-abusiveness, physical and/or verbal: Absent Agitated behavior scale total score: 21    Therapy/Group: Individual Therapy  Rosalio Loud 09/19/2020, 9:48 AM
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
## 2020-09-19 NOTE — Progress Notes (Signed)
Occupational Therapy Weekly Progress Note  Patient Details  Name: Steve Mejia MRN: [id number] Date of Birth: 01/15/2001  Beginning of progress report period: September 06, 2020 End of progress report period: Sep 19, 2020  Patient has met 3 of 3 short term goals.  Patient is making steady progress towards OT goals. Pt is at an overall min A level for functional BADL tasks. He has demonstrated improved attention, awareness, and balance within functional tasks. Continue current POC.  Patient continues to demonstrate the following deficits: muscle weakness, decreased attention, decreased awareness, decreased problem solving, decreased safety awareness, decreased memory and delayed processing and decreased standing balance, decreased postural control and decreased balance strategies and therefore will continue to benefit from skilled OT intervention to enhance overall performance with BADL.  Patient progressing toward long term goals..  Continue plan of care.  OT Short Term Goals Week 2:  OT Short Term Goal 1 (Week 2): Patient will complete bathing tasks with mod verbal cues for safety OT Short Term Goal 1 - Progress (Week 2): Met OT Short Term Goal 2 (Week 2): Patient will complete 2 steps of toileting task with min A OT Short Term Goal 2 - Progress (Week 2): Met OT Short Term Goal 3 (Week 2): Patient will maintain attention to self feeding task with min cues OT Short Term Goal 3 - Progress (Week 2): Met Week 3:  OT Short Term Goal 1 (Week 3): Patient will maintain standing balance at the si ufjnk with CGA OT Short Term Goal 2 (Week 3): Patient will only require min verbal cues for safety awareness within BADL task OT Short Term Goal 3 (Week 3): Patient will maintain attention to task with min questioning cues   Therapy/Group: Individual Therapy   S  09/19/2020, 4:08 PM  

## 2020-09-19 NOTE — Progress Notes (Signed)
Speech Language Pathology Weekly Progress and Session Note  Patient Details  Name: LAMARIO MANI MRN: [id number] Date of Birth: 2000-11-02  Beginning of progress report period: September 12, 2020 End of progress report period: Sep 19, 2020  Today's Date: 09/19/2020 SLP Individual Time: 0725-0810 SLP Individual Time Calculation (min): 45 min  Short Term Goals: Week 2: SLP Short Term Goal 1 (Week 2): Patient will consume current diet with minimal overt s/s of aspiration with Mod I for use of swallowing compensatory strategies. SLP Short Term Goal 1 - Progress (Week 2): Met SLP Short Term Goal 2 (Week 2): Patient will utilize external aids for orientation to time, place and situation with suprevision level verbal and visual cues. SLP Short Term Goal 2 - Progress (Week 2): Met SLP Short Term Goal 3 (Week 2): Patient will demonstrate selective attention to tasks in a mildly dsitracting enviornment for 30 minutes with Min verbal cues for redirection. SLP Short Term Goal 3 - Progress (Week 2): Not met SLP Short Term Goal 4 (Week 2): Patient will self-monitor and correct errors during functional tsaks with Min verbal cues. SLP Short Term Goal 4 - Progress (Week 2): Met SLP Short Term Goal 5 (Week 2): Patient will demonstrate functional problem solving for basic and familiar tasks with Mod verbal cues. SLP Short Term Goal 5 - Progress (Week 2): Met SLP Short Term Goal 6 (Week 2): Patient will reduce impulsivity and will count to 3 aloud prior to initiating movement with Max verbal cues. SLP Short Term Goal 6 - Progress (Week 2): Not met    New Short Term Goals: Week 3: SLP Short Term Goal 1 (Week 3): Patient will self-monitor and correct verboisty/perseveration on topics with Mod A verbal and visual cues. SLP Short Term Goal 2 (Week 3): Patient will demonstrate functional problem solving for basic and familiar tasks with Min verbal cues. SLP Short Term Goal 3 (Week 3): Patient will demonstrate  sustained attention to functional tasks for 45 minutes with Mod verbal cues for redirection.  Weekly Progress Updates: Patient has made functional gains and has met 4 of 6 STGs this reporting period. Currently, patient demonstrates behaviors consistent with a Rancho Level VI and requires overall mod-max A multimodal cues to complete functional and familiar tasks safely in regards to problem solving, awareness and attention. Patient's function can be impacted by pain, frustration and verbosity at times. Patient is also consuming Dys. 1 textures with thin liquids without overt s/s of aspiration with overall supervision level verbal cues for use of swallowing compensatory strategies. Patient will remain on this diet per the oral surgeons orders. Patient and family education ongoing. Patient would benefit from continued skilled SLP intervention to maximize his cognitive and swallowing function prior to discharge.      Intensity: Minumum of 1-2 x/day, 30 to 90 minutes Frequency: 3 to 5 out of 7 days Duration/Length of Stay: 10/01/20 Treatment/Interventions: Cognitive remediation/compensation;Speech/Language facilitation;Cueing hierarchy;Functional tasks;Therapeutic Activities;Therapeutic Exercise;Dysphagia/aspiration precaution training;Medication managment;Patient/family education;Environmental controls   Daily Session  Skilled Therapeutic Interventions: Skilled treatment session focused on dysphagia and cognitive goals. Upon arrival, patient was asleep but easily awakened. Patient transferred to the chair with extra time and overall contact guard. Patient was verbose and perseverative on consistent topics of conversastion requiring Mod verbal cues to alternate attention between self-feeding and conversation. Patient consumed Dys. 1 textures with thin liquids without overt s/s of aspiration but recommend ongoing full supervision. Patient continues to demonstrate poor insight regarding deficits and overall  anticipatory awareness  for discharge planning. Patient transferred back to enclosure bed at end of session and left with all needs within reach. Continue with current plan of care.          Pain No/Denies Pain   Therapy/Group: Individual Therapy  Marianela Mandrell 09/19/2020, 6:48 AM
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
## 2020-09-20 MED ORDER — PANTOPRAZOLE SODIUM 40 MG PO PACK
40.0000 mg | PACK | Freq: Every day | ORAL | Status: DC
  Administered 2020-09-20 – 2020-10-01 (×12): 40 mg via ORAL
  Filled 2020-09-20 (×11): qty 20
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
## 2020-09-20 NOTE — Plan of Care (Signed)
  Problem: RH SAFETY Goal: RH STG ADHERE TO SAFETY PRECAUTIONS W/ASSISTANCE/DEVICE Description: STG Adhere to Safety Precautions With Supervision Assistance/Device. Outcome: Not Progressing; enclosure bed

## 2020-09-20 NOTE — Progress Notes (Signed)
Speech Language Pathology TBI Note  Patient Details  Name: Steve Mejia MRN: [id number] Date of Birth: 2000-10-02  Today's Date: 09/20/2020  SLP Individual Time: 1300-1320 SLP Individual Time Calculation (min): 20 min  SLP Individual Time: 1450-1515 SLP Individual Time Calculation (min): 25 min  Short Term Goals: Week 3: SLP Short Term Goal 1 (Week 3): Patient will self-monitor and correct verboisty/perseveration on topics with Mod A verbal and visual cues. SLP Short Term Goal 2 (Week 3): Patient will demonstrate functional problem solving for basic and familiar tasks with Min verbal cues. SLP Short Term Goal 3 (Week 3): Patient will demonstrate sustained attention to functional tasks for 45 minutes with Mod verbal cues for redirection.  Skilled Therapeutic Interventions: Session 1: Pt seen for skilled ST with focus on cognitive goals, pt very lethargic at this time, requiring max multimodal cues to wake up and maintain attention. SLP facilitating problem solving task by providing max A cues to maintain attention and recall current cognitive and physical impairments. Assist level secondary to lethargy. At this point, pt eyes closed and appeared to be sleeping, only stating "sleep" when SLP attempting to engage. Session ended early, will re-attempt later.   Session 2: Pt awake sitting EOB with grandmother present having just finished mashed potatoes and gravy. Pt acknowledging lethargy earlier, stating he received medication for sternal pain. Pt agreeable to trial further Dys 1 snack, tolerating with SPV level cues for swallow precautions. Pt remains minimally lethargic at this time, less verbose than previous tx sessions. SLP completing memory notebook to increase recall and carryover of therapeutic activities. Pt left EOB with grandma present, all needs met. Cont ST POC.  Pain Pain Assessment Pain Scale: 0-10 Pain Score: 0-No pain  Agitated Behavior Scale: TBI Observation  Details Observation Environment: room Start of observation period - Date: 09/20/20 Start of observation period - Time: 1300 End of observation period - Date: 09/20/20 End of observation period - Time: 1320 Agitated Behavior Scale (DO NOT LEAVE BLANKS) Short attention span, easy distractibility, inability to concentrate: Present to a slight degree Impulsive, impatient, low tolerance for pain or frustration: Absent Uncooperative, resistant to care, demanding: Absent Violent and/or threatening violence toward people or property: Absent Explosive and/or unpredictable anger: Absent Rocking, rubbing, moaning, or other self-stimulating behavior: Absent Pulling at tubes, restraints, etc.: Absent Wandering from treatment areas: Absent Restlessness, pacing, excessive movement: Absent Repetitive behaviors, motor, and/or verbal: Absent Rapid, loud, or excessive talking: Absent Sudden changes of mood: Absent Easily initiated or excessive crying and/or laughter: Absent Self-abusiveness, physical and/or verbal: Absent Agitated behavior scale total score: 15  Therapy/Group: Individual Therapy  Dewaine Conger 09/20/2020, 3:20 PM

## 2020-09-20 NOTE — Progress Notes (Signed)
Patient did good with enclosure bed unzipped. Grandma was in today with no problems. Patient is calm and says next week will be his  last week here. Very good appetite,.
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
## 2020-09-21 NOTE — Plan of Care (Signed)
  Problem: RH SAFETY Goal: RH STG ADHERE TO SAFETY PRECAUTIONS W/ASSISTANCE/DEVICE Description: STG Adhere to Safety Precautions With Supervision Assistance/Device. Outcome: Progressing; patient on enclosure bed; did well with him sitting on the side of the bed unzipped. Goes straight back to bed after eating.

## 2020-09-21 NOTE — Progress Notes (Signed)
Speech Language Pathology TBI Note  Patient Details  Name: Steve Mejia MRN: [id number] Date of Birth: 2000/09/21  Today's Date: 09/21/2020 SLP Individual Time: 1050-1115 SLP Individual Time Calculation (min): 25 min  Short Term Goals: Week 3: SLP Short Term Goal 1 (Week 3): Patient will self-monitor and correct verboisty/perseveration on topics with Mod A verbal and visual cues. SLP Short Term Goal 2 (Week 3): Patient will demonstrate functional problem solving for basic and familiar tasks with Min verbal cues. SLP Short Term Goal 3 (Week 3): Patient will demonstrate sustained attention to functional tasks for 45 minutes with Mod verbal cues for redirection.  Skilled Therapeutic Interventions:  Pt was seen for skilled ST targeting cognitive goals.  Pt was resting in enclosure bed upon therapist's arrival, on the phone with his brother.  Pt ended phone call appropriately and was agreeable to participate in treatment.  SLP facilitated the session with a novel card game to address problem solving and attention goals.  Pt was able to plan and execute a problem solving strategy within task with no more than min assist verbal cues and he sustained his attention to task for its duration (~15 min) in a quiet environment with min verbal cues for redirection.  Pt was returned to enclosure bed and left with call bell within reach.  Continue per current plan of care.    Pain Pain Assessment Pain Scale: 0-10 Pain Score: 0-No pain  Agitated Behavior Scale: TBI Observation Details Observation Environment: room Start of observation period - Date: 09/21/20 Start of observation period - Time: 1050 End of observation period - Date: 09/21/20 End of observation period - Time: 1115 Agitated Behavior Scale (DO NOT LEAVE BLANKS) Short attention span, easy distractibility, inability to concentrate: Present to a slight degree Impulsive, impatient, low tolerance for pain or frustration: Absent Uncooperative,  resistant to care, demanding: Absent Violent and/or threatening violence toward people or property: Absent Explosive and/or unpredictable anger: Absent Rocking, rubbing, moaning, or other self-stimulating behavior: Absent Pulling at tubes, restraints, etc.: Absent Wandering from treatment areas: Absent Restlessness, pacing, excessive movement: Absent Repetitive behaviors, motor, and/or verbal: Absent Rapid, loud, or excessive talking: Absent Sudden changes of mood: Absent Easily initiated or excessive crying and/or laughter: Absent Self-abusiveness, physical and/or verbal: Absent Agitated behavior scale total score: 15  Therapy/Group: Individual Therapy  Analee Montee, Melanee Spry 09/21/2020, 12:34 PM

## 2020-09-22 LAB — CBC
HCT: 38.7 % — ABNORMAL LOW (ref 39.0–52.0)
Hemoglobin: 12.7 g/dL — ABNORMAL LOW (ref 13.0–17.0)
MCH: 28.3 pg (ref 26.0–34.0)
MCHC: 32.8 g/dL (ref 30.0–36.0)
MCV: 86.4 fL (ref 80.0–100.0)
Platelets: 213 10*3/uL (ref 150–400)
RBC: 4.48 MIL/uL (ref 4.22–5.81)
RDW: 13.2 % (ref 11.5–15.5)
WBC: 4.6 10*3/uL (ref 4.0–10.5)
nRBC: 0 % (ref 0.0–0.2)

## 2020-09-22 LAB — BASIC METABOLIC PANEL
Anion gap: 7 (ref 5–15)
BUN: 10 mg/dL (ref 6–20)
CO2: 29 mmol/L (ref 22–32)
Calcium: 9.5 mg/dL (ref 8.9–10.3)
Chloride: 103 mmol/L (ref 98–111)
Creatinine, Ser: 0.99 mg/dL (ref 0.61–1.24)
GFR, Estimated: >60 mL/min (ref >60)
Glucose, Bld: 88 mg/dL (ref 70–99)
Potassium: 4.3 mmol/L (ref 3.5–5.1)
Sodium: 139 mmol/L (ref 135–145)

## 2020-09-22 NOTE — Progress Notes (Signed)
Physical Therapy Session Note  Patient Details  Name: Steve Mejia MRN: [id number] Date of Birth: March 23, 2001  Today's Date: 09/22/2020 PT Individual Time: 0800-0900 PT Individual Time Calculation (min): 60 min   Short Term Goals: Week 1:  PT Short Term Goal 1 (Week 1): Pt will ambulate 13f w/mod assist of 2 PT Short Term Goal 1 - Progress (Week 1): Met PT Short Term Goal 2 (Week 1): Pt will perform stand pivot transfer to wc w/mod assist of 1 PT Short Term Goal 2 - Progress (Week 1): Met PT Short Term Goal 3 (Week 1): Pt will stand statically w/bilat UE to single UE support and mod assist of 1 during functional task/activity PT Short Term Goal 3 - Progress (Week 1): Met PT Short Term Goal 4 (Week 1): Pt will sit on edge of bed/mat w/min assist for at least 1 min PT Short Term Goal 4 - Progress (Week 1): Met Week 2:  PT Short Term Goal 1 (Week 2): Pt will perform bed to chair transfer with CGA. PT Short Term Goal 1 - Progress (Week 2): Progressing toward goal PT Short Term Goal 2 (Week 2): Pt will ambulate x200' with CGA. PT Short Term Goal 2 - Progress (Week 2): Progressing toward goal PT Short Term Goal 3 (Week 2): Pt will not attempt to stand from chair prior to being cued, demonstrating improvement in impulsivity. PT Short Term Goal 3 - Progress (Week 2): Progressing toward goal Week 3:  PT Short Term Goal 1 (Week 3): Pt will ambulate 300' with CGA. PT Short Term Goal 2 (Week 3): Pt will perform bed to chair transfer consistently with CGA. PT Short Term Goal 3 (Week 3): Pt will not attempt to stand from chair prior to being cued, demonstrating improvement in impulsivity.     Skilled Therapeutic Interventions/Progress Updates:    pt received in sitting in bed eating breakfast with NCT present, handed out to therapy, and agreeable to therapy. Pt denied pain throughout session. Pt completed breakfast and directed in Sit to stand CGA and gait to dayroom 757 no AD CGA with noted trunk  sway laterally. pt directed in dynamic functional gait training without AD CGA for 60' x4 with x2 seated rest breaks with duel task training with card matching and bringing cards to end gait target. Pt then directed in 150' x2 without AD CGA with duel task with categorical naming  With pt able to complete both categories though did require extra time. Pt then directed in standing balance training with duel task with card games as pt reported he enjoyed this, with intermittent VC for task attention and posture. Pt then returned to room 75' CGA and returned to bed SBA. Pt then left in Posey bed, All needs in reach and in good condition. Call light in hand.  And enclosure bed completed.   Therapy Documentation Precautions:  Precautions Precautions: Fall,Other (comment),Sternal Precaution Comments: TBI, sternal, NG tube and full liquid diet Restrictions Weight Bearing Restrictions: No Other Position/Activity Restrictions: Sternal Precautions General:   Vital Signs:   Pain:   Mobility:   Locomotion :    Trunk/Postural Assessment :    Balance:   Exercises:   Other Treatments:      Therapy/Group: Individual Therapy  HJunie Panning5/01/2021, 12:20 PM
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
## 2020-09-22 NOTE — Progress Notes (Signed)
Speech Language Pathology TBI Note  Patient Details  Name: Steve Mejia MRN: [id number] Date of Birth: Nov 24, 2000  Today's Date: 09/22/2020 SLP Individual Time: 1300-1355 SLP Individual Time Calculation (min): 55 min  Short Term Goals: Week 3: SLP Short Term Goal 1 (Week 3): Patient will self-monitor and correct verboisty/perseveration on topics with Mod A verbal and visual cues. SLP Short Term Goal 2 (Week 3): Patient will demonstrate functional problem solving for basic and familiar tasks with Min verbal cues. SLP Short Term Goal 3 (Week 3): Patient will demonstrate sustained attention to functional tasks for 45 minutes with Mod verbal cues for redirection.  Skilled Therapeutic Interventions: Skilled treatment session focused on cognitive goals. SLP facilitated session by providing Min A verbal cues for recall of his current medications and their functions. Patient organized a 3X/day pill box with extra time and overall Mod I for problem solving. Patient also participated in a previously learned task with overall supervision level verbal cues needed for recall and problem solving. Patient was less verbose today with less perseveration noted. Of note, patient appeared lethargic today, patient reported due to talking on the phone all night. RN made aware. Patient left in enclosure bed with all needs within reach. Continue with current plan of care.      Pain No/Denies Pain   Agitated Behavior Scale: TBI Observation Details Observation Environment: Patient's room Start of observation period - Date: 09/22/20 Start of observation period - Time: 1300 End of observation period - Date: 09/22/20 End of observation period - Time: 1355 Agitated Behavior Scale (DO NOT LEAVE BLANKS) Short attention span, easy distractibility, inability to concentrate: Present to a slight degree Impulsive, impatient, low tolerance for pain or frustration: Absent Uncooperative, resistant to care, demanding:  Absent Violent and/or threatening violence toward people or property: Absent Explosive and/or unpredictable anger: Absent Rocking, rubbing, moaning, or other self-stimulating behavior: Absent Pulling at tubes, restraints, etc.: Absent Wandering from treatment areas: Absent Restlessness, pacing, excessive movement: Absent Repetitive behaviors, motor, and/or verbal: Absent Rapid, loud, or excessive talking: Absent Sudden changes of mood: Absent Easily initiated or excessive crying and/or laughter: Absent Self-abusiveness, physical and/or verbal: Absent Agitated behavior scale total score: 15  Therapy/Group: Individual Therapy  Steve Mejia 09/22/2020, 2:03 PM

## 2020-09-22 NOTE — Progress Notes (Signed)
Occupational Therapy TBI Note  Patient Details  Name: Steve Mejia MRN: [id number] Date of Birth: Nov 21, 2000  Today's Date: 09/22/2020  Session 1 OT Individual Time: 1000-1015 OT Individual Time Calculation (min): 15 min   Session 2 OT Individual Time: 6599-3570 OT Individual Time Calculation (min): 73 min    Short Term Goals: Week 3:  OT Short Term Goal 1 (Week 3): Patient will maintain standing balance at the si ufjnk with CGA OT Short Term Goal 2 (Week 3): Patient will only require min verbal cues for safety awareness within BADL task OT Short Term Goal 3 (Week 3): Patient will maintain attention to task with min questioning cues  Skilled Therapeutic Interventions/Progress Updates:    Session 1 Pt greeted semi-reclined in enclosure bed asleep. Pt needed max multimodal cues to wake. Pt was able to verbalize what he did in earlier PT session for OT to write in memory notebook. Pt then closed his eyes again and went back to sleep. OT unable to get pt to wake back up and participate. Pt missed skilled therapy time and will attempt to make up time later toady.   Session 2 Pt greeted semi-reclined in bed asleep still, but able to wake and stay awake after OT provided pt with warm wash cloth for face. Pt agreeable to showering. Pt quiet today and stated he was tired. Pt stated he was tired because he received Vicodin for pain last night. Pt completed bed mobility with supervision. Pt ambulated in room to collect clothing with CGA for balance. Bathing completed sit<>stand from tub bench with min cues for balance strategies and safety. Dressing completed form stratight back chair with overall close supervision. OT discussed home set-up and possible DME needs. Pt stated he has a walk-in shower and could use a seat in the shower. OT suggested using BSC in shower as a shower seat for the time being. Pt agreeable. Pt set-up with lunch and was able to self-feed meal with set-up A. Had pt read out  names of food items from receipt. While pt was eating, worked on dual task of naming food items alphabetically. Pt handoff to SLP for next therapy session.   Therapy Documentation Precautions:  Precautions Precautions: Fall,Other (comment),Sternal Precaution Comments: TBI, sternal, NG tube and full liquid diet Restrictions Weight Bearing Restrictions: No Other Position/Activity Restrictions: Sternal Precautions Pain:  DENIES PAIN Agitated Behavior Scale: TBI Observation Details Observation Environment: Room Start of observation period - Date: 09/22/20 Start of observation period - Time: 1145 End of observation period - Date: 09/22/20 End of observation period - Time: 1300 Agitated Behavior Scale (DO NOT LEAVE BLANKS) Short attention span, easy distractibility, inability to concentrate: Present to a slight degree Impulsive, impatient, low tolerance for pain or frustration: Absent Uncooperative, resistant to care, demanding: Absent Violent and/or threatening violence toward people or property: Absent Explosive and/or unpredictable anger: Absent Rocking, rubbing, moaning, or other self-stimulating behavior: Absent Pulling at tubes, restraints, etc.: Absent Wandering from treatment areas: Absent Restlessness, pacing, excessive movement: Absent Repetitive behaviors, motor, and/or verbal: Absent Rapid, loud, or excessive talking: Absent Sudden changes of mood: Absent Easily initiated or excessive crying and/or laughter: Absent Self-abusiveness, physical and/or verbal: Absent Agitated behavior scale total score: 15   Therapy/Group: Individual Therapy  Mal Amabile 09/22/2020, 1:12 PM

## 2020-09-23 NOTE — Progress Notes (Signed)
Occupational Therapy Session Note  Patient Details  Name: Steve Mejia MRN: [id number] Date of Birth: 07/19/2000  Today's Date: 09/23/2020 OT Individual Time: 4098-1191 OT Individual Time Calculation (min): 26 min    Short Term Goals: Week 2:  OT Short Term Goal 1 (Week 2): Patient will complete bathing tasks with mod verbal cues for safety OT Short Term Goal 1 - Progress (Week 2): Met OT Short Term Goal 2 (Week 2): Patient will complete 2 steps of toileting task with min A OT Short Term Goal 2 - Progress (Week 2): Met OT Short Term Goal 3 (Week 2): Patient will maintain attention to self feeding task with min cues OT Short Term Goal 3 - Progress (Week 2): Met Week 3:  OT Short Term Goal 1 (Week 3): Patient will maintain standing balance at the si ufjnk with CGA OT Short Term Goal 2 (Week 3): Patient will only require min verbal cues for safety awareness within BADL task OT Short Term Goal 3 (Week 3): Patient will maintain attention to task with min questioning cues   Skilled Therapeutic Interventions/Progress Updates:    pt greeted at time of session sitting up in wheelchair with NT performing grooming/fixing his hair, agreeable to OT and no pain. Ambulated wheelchair > sink > gym > back to room with CGA with mild trunk sway but no LOB and no AD. In gym, pt playing card matching game with walking approx 20 feet distance to pick up a card and match it on another table, CGA throughout and 10/10 accuracy with cue x1. Pt up in wheelchair with NT present, call bell in reach and alarm in tact.   Therapy Documentation Precautions:  Precautions Precautions: Fall,Other (comment),Sternal Precaution Comments: TBI, sternal, NG tube and full liquid diet Restrictions Weight Bearing Restrictions: No Other Position/Activity Restrictions: Sternal Precautions     Therapy/Group: Individual Therapy  Viona Gilmore 09/23/2020, 7:28 AM

## 2020-09-23 NOTE — Progress Notes (Signed)
Physical Therapy Session Note  Patient Details  Name: Steve Mejia MRN: [id number] Date of Birth: [birth year]  Today's Date: 09/23/2020 PT Individual Time: 1101-1156 PT Individual Time Calculation (min): 55 min   Short Term Goals: Week 3:  PT Short Term Goal 1 (Week 3): Pt will ambulate 300' with CGA. PT Short Term Goal 2 (Week 3): Pt will perform bed to chair transfer consistently with CGA. PT Short Term Goal 3 (Week 3): Pt will not attempt to stand from chair prior to being cued, demonstrating improvement in impulsivity.  Skilled Therapeutic Interventions/Progress Updates:     Pt received supine in bed and agrees to therapy. Pt had been asleep but was easily roused. No complaint of pain. Pt performs bed mobility independently. Sit to stand with CGA and pt ambulates to toilet with CGA and no AD. Pt then ambulates to sink and performs ADLs in standing with close supervision from PT and cues for positioning. WC transport to gym for time management. Pt performs Berg balance assessment, demonstrating greatest difficulty with single leg stance and semi tandem stance challenges. PT cues for upright gaze to improve posture and balance. Pt then performs 6 minute walk test following PT education on rationale and instructions for correct performance. Pt ambulates 840' without AD and with close supervision. No LOBs during walk tests. PT then provides pt with standard manual WC to replace TIS WC. WC transport back to room. Stand step transfer back to bed with supervision. Pt left supine with alarm intact and all needs within reach.  Therapy Documentation Precautions:  Precautions Precautions: Fall,Other (comment),Sternal Precaution Comments: TBI, sternal, NG tube and full liquid diet Restrictions Weight Bearing Restrictions: No Other Position/Activity Restrictions: Sternal Precautions   Therapy/Group: Individual Therapy  Beau Fanny, PT, DPT 09/23/2020, 12:33 PM

## 2020-09-23 NOTE — Progress Notes (Signed)
Occupational Therapy TBI Note  Patient Details  Name: Steve Mejia MRN: [id number] Date of Birth: 2001/02/09  Today's Date: 09/23/2020 OT Individual Time: 1300-1359 OT Individual Time Calculation (min): 59 min    Short Term Goals: Week 3:  OT Short Term Goal 1 (Week 3): Patient will maintain standing balance at the si ufjnk with CGA OT Short Term Goal 2 (Week 3): Patient will only require min verbal cues for safety awareness within BADL task OT Short Term Goal 3 (Week 3): Patient will maintain attention to task with min questioning cues  Skilled Therapeutic Interventions/Progress Updates:    Pt greeted semi-reclined in bed and agreeable to OT treatment session. Pt wanted to shower today. Pt ambulated around room to collect clothing and ambulate to bathroom. Pt able to stand and doff clothing with improved standing balance to remove clothing. Bathing sit<>stand from shower bench with supervision overall. Pt ambulated out of shower with close supervision and donned clean clothing seated EOB. Pt ambulated in room to collect dirty clothing, then ambulated to laundry room to place clothing in wash. Pt needed verbal cues for how to set washing machine. He then returned to room and worked on [employer] for today. Pt needed verbal cues for spelling and to make bigger spaces between lines. Memory notebook completed while pt getting hair done.  Therapy Documentation Precautions:  Precautions Precautions: Fall,Other (comment),Sternal Precaution Comments: TBI, sternal, NG tube and full liquid diet Restrictions Weight Bearing Restrictions: No Other Position/Activity Restrictions: Sternal Precautions Pain:  denies pain Agitated Behavior Scale: TBI Observation Details Observation Environment: Room Start of observation period - Date: 09/23/20 Start of observation period - Time: 1300 End of observation period - Date: 09/23/20 End of observation period - Time: 1359 Agitated Behavior Scale  (DO NOT LEAVE BLANKS) Short attention span, easy distractibility, inability to concentrate: Present to a slight degree Impulsive, impatient, low tolerance for pain or frustration: Absent Uncooperative, resistant to care, demanding: Absent Violent and/or threatening violence toward people or property: Absent Explosive and/or unpredictable anger: Absent Rocking, rubbing, moaning, or other self-stimulating behavior: Absent Pulling at tubes, restraints, etc.: Absent Wandering from treatment areas: Absent Restlessness, pacing, excessive movement: Absent Repetitive behaviors, motor, and/or verbal: Absent Rapid, loud, or excessive talking: Absent Sudden changes of mood: Absent Easily initiated or excessive crying and/or laughter: Absent Self-abusiveness, physical and/or verbal: Absent Agitated behavior scale total score: 15   Therapy/Group: Individual Therapy  Mal Amabile 09/23/2020, 1:59 PM
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
## 2020-09-23 NOTE — Patient Care Conference (Signed)
Inpatient RehabilitationTeam Conference and Plan of Care Update Date: 09/23/2020   Time: 10:38 AM    Patient Name: Steve Mejia      Medical Record Number: [id number]  Date of Birth: [birth year] Sex: Male         Room/Bed: 4W14C/4W14C-01 Payor Info: Payor: MED PAY / Plan: MED PAY ASSURANCE / Product Type: *No Product type* /    Admit Date/Time:  09/05/2020 12:32 PM  Primary Diagnosis:  TBI (traumatic brain injury) [facility])  Hospital Problems: Principal Problem:   TBI (traumatic brain injury) [facility])    Expected Discharge Date: Expected Discharge Date: 10/01/20  Team Members Present: Physician leading conference: Dr. Faith Rogue Care Coodinator Present: Cecile Sheerer, LCSWA;Katrinia Straker Marlyne Beards, RN, BSN, CRRN Nurse Present: Kennyth Arnold, RN PT Present: Malachi Pro, PT OT Present: Kearney Hard, OT SLP Present: Feliberto Gottron, SLP PPS Coordinator present : Fae Pippin, SLP     Current Status/Progress Goal Weekly Team Focus  Bowel/Bladder   Patient is continent of B/B. LBM 09/20/2020.  Patient will remain continent of B/B.  Toilet patient PRN. Administer PRN laxitives as ordered.   Swallow/Nutrition/ Hydration   Dys. 1 textures with thin liquids, supervision  Mod I  safety with current diet   ADL's   CGA  CGA/supervision  self-care retraining, attention, balance, cognitive retraining, pt.family education   Mobility   Independent bed mobility, CGA transfers and ambulation, decreased safety awareness and higher level balance  supervision  attention, balance, multitasking   Communication             Safety/Cognition/ Behavioral Observations  Min-Mod A  Min A  complex problem solving, attention, awareness   Pain   Pain is 6/10 during this shift.  <=3/10.  Assess pain Q shift and PRN. Administer PRN pain medication as ordered.   Skin   Surgical incision to mid chest.  Surgical site will remain free from s/s of infection.  Assess skin Q shift and PRN.     Discharge  Planning:  Pt is uninsured. D/c to home grandmother's home due it being a one level home. Pt to have support from mother/father and extending natural supports. Mother to begin applying for Medicaid and SSDI.   Team Discussion: Doing great, out of enclosure bed, wires will come out of mouth after discharge. Continent B/B. Having a tendency to stay up all night on the phone and then wanting to sleep more during the day.  Patient on target to meet rehab goals: yes, mostly contact guard assist. Was nauseated this morning. Contact guard assist with occasional loss of balance when not focused. SLP reports does better with a structured task. Mom has been bringing in food but all the food items seem to be a soft chew and MD is okay with that.  *See Care Plan and progress notes for long and short-term goals.   Revisions to Treatment Plan:  Discontinued the enclosure bed.  Teaching Needs: Family education, medication management, pain management, skin/wound care, behavior management, transfer training, gait training, balance training, endurance training, safety awareness.  Current Barriers to Discharge: Decreased caregiver support, Medical stability, Home enviroment access/layout, Lack of/limited family support, Medication compliance, Behavior and Nutritional means  Possible Resolutions to Barriers: Continue current medications, offer nutritional support, provide emotional support.     Medical Summary Current Status: improved pain control. behavior better, less agitated. out of enclosure bed  Barriers to Discharge: Medical stability;Behavior   Possible Resolutions to [employer]: daily assessment of meds, labs. behavioral mgt, nutrition  Continued Need for Acute Rehabilitation Level of Care: The patient requires daily medical management by a physician with specialized training in physical medicine and rehabilitation for the following reasons: Direction of a multidisciplinary physical  rehabilitation program to maximize functional independence : Yes Medical management of patient stability for increased activity during participation in an intensive rehabilitation regime.: Yes Analysis of laboratory values and/or radiology reports with any subsequent need for medication adjustment and/or medical intervention. : Yes   I attest that I was present, lead the team conference, and concur with the assessment and plan of the team.   Tennis Must 09/23/2020, 2:45 PM

## 2020-09-23 NOTE — Progress Notes (Signed)
Occupational Therapy Note  Patient Details  Name: Steve Mejia MRN: [id number] Date of Birth: [birth year]  Today's Date: 09/23/2020 OT Missed Time: 30 Minutes Missed Time Reason: Patient ill (comment) (nausea)  Pt greeted prone in regular bed. Pt rolling around and stating he felt sick. Pt declined need for emesis bag but declined to get up right now due to nausea.    Steve Mejia 09/23/2020, 10:16 AM

## 2020-09-23 NOTE — Progress Notes (Signed)
Speech Language Pathology Daily Session Note  Patient Details  Name: LONNY EISEN MRN: [id number] Date of Birth: 27-Apr-2001  Today's Date: 09/23/2020 SLP Individual Time: 1400-1430 SLP Individual Time Calculation (min): 30 min  Short Term Goals: Week 3: SLP Short Term Goal 1 (Week 3): Patient will self-monitor and correct verboisty/perseveration on topics with Mod A verbal and visual cues. SLP Short Term Goal 2 (Week 3): Patient will demonstrate functional problem solving for basic and familiar tasks with Min verbal cues. SLP Short Term Goal 3 (Week 3): Patient will demonstrate sustained attention to functional tasks for 45 minutes with Mod verbal cues for redirection.  Skilled Therapeutic Interventions: Skilled treatment session focused on cognitive goals. SLP facilitated session by providing extra time and overall Min A verbal cues for organization and problem solving during a complex scheduling task. Patient demonstrated appropriate attention to task despite a moderately distracting environment. Patient left upright in wheelchair with alarm on and all needs within reach. Continue with current plan of care.      Pain No/Denies Pain   Therapy/Group: Individual Therapy  Nikoloz Huy 09/23/2020, 3:18 PM

## 2020-09-23 NOTE — Progress Notes (Signed)
Patient ID: Steve Mejia, male   DOB: 01/03/2001, 20 y.o.   MRN: [id number]  SW left message for pt mother [name] [phone]) to provide updates from team conference, discuss family education, and d/c date remains; SW waiting on follow-up.   Cecile Sheerer, MSW, LCSWA Office: [phone] Cell: [phone] Fax: [phone]

## 2020-09-24 NOTE — Progress Notes (Signed)
Patient ID: Steve Mejia, male   DOB: 2001-02-19, 20 y.o.   MRN: [id number]  SW received return phone call from pt mother Steve Mejia. SW provided updates on gains pt has been since being here in rehab, d/c date remains 5/18, discussed family education, pt will d/c on liquid diet with bar remaining in mouth and will have follow-up with oral surgeon post discharge; and discussed charity HH/[facility] and [facility] medication assistance program. SW informed will provide further updates once d/c recommendations are in place. SW encouraged follow-up with PCP. She intends to follow-up with his PCP and see if she will continue to see him. Mother will follow-up with SW to inform on when family edu will be with different family members. SW did not encourage more than 2 people per family edu session due to allowing therapists to be able to educate, and also advised family members who come in can teach other family members.   SW will continue to follow for d/c needs.   Cecile Sheerer, MSW, LCSWA Office: [phone] Cell: [phone] Fax: [phone]

## 2020-09-24 NOTE — Progress Notes (Signed)
Physical Therapy Session Note  Patient Details  Name: Steve Mejia MRN: [id number] Date of Birth: 2000-05-23  Today's Date: 09/24/2020 PT Individual Time: 1330-1445 PT Individual Time Calculation (min): 75 min   Short Term Goals: Week 3:  PT Short Term Goal 1 (Week 3): Pt will ambulate 300' with CGA. PT Short Term Goal 2 (Week 3): Pt will perform bed to chair transfer consistently with CGA. PT Short Term Goal 3 (Week 3): Pt will not attempt to stand from chair prior to being cued, demonstrating improvement in impulsivity.  Skilled Therapeutic Interventions/Progress Updates:     Pt received supine in bed and agrees to therapy. No complaint of pain. Supine to sit independently. Pt then performs sit to stand with close supervision and cues for safety. Pt challenged to perform tpath-finding activitiy, tasked with locating different landmarks within hospital. PT provides occasional cues for re-orentation to physical location as well as virtal loctation on map. No LOBs. Pt ambulaets >1000' during activity. PT provides occasional cues for upright gaze to improve posture and balance. With minimal verbal cues, pt is able to locate cafeteria, chapel, main entrance, and [employer]. Pt then takes extended seated rest break. Pt ambulates down 20 steps with R hand rail and very close supervision, with cues for stepping technique.  Pt then navigates elevator with minimal verbal cues and ambulates to therapy gym. Pt performs NMR for standing balance with catch-and-rebound activity with trampoline. Pt tosses and catches ball while tasked to perform multiplication and some division. Pt does not have any LOBs but is noted to stop tossing ball when given difficulty multiplication task. Pt attempts on BOSU ball but loses balance several times, requiring maxA to prevent fall. Pt steps on and over BOSU ball x6 reps with CGA and with alternating lower extremities. Pt ambulates back to room with supervision. Left supine in bed  with alarm intact and all needs within reach.  Therapy Documentation Precautions:  Precautions Precautions: Fall,Other (comment),Sternal Precaution Comments: TBI, sternal, NG tube and full liquid diet Restrictions Weight Bearing Restrictions: No Other Position/Activity Restrictions: Sternal Precautions    Therapy/Group: Individual Therapy  Beau Fanny, PT, DPT 09/24/2020, 3:51 PM
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
## 2020-09-24 NOTE — Progress Notes (Signed)
Physical Therapy TBI Note  Patient Details  Name: Steve Mejia MRN: [id number] Date of Birth: 01/21/2001  Today's Date: 09/24/2020 PT Individual Time: 1103-1130 PT Individual Time Calculation (min): 27 min   Short Term Goals: Week 3:  PT Short Term Goal 1 (Week 3): Pt will ambulate 300' with CGA. PT Short Term Goal 2 (Week 3): Pt will perform bed to chair transfer consistently with CGA. PT Short Term Goal 3 (Week 3): Pt will not attempt to stand from chair prior to being cued, demonstrating improvement in impulsivity.  Skilled Therapeutic Interventions/Progress Updates:     Patient in bed upon PT arrival. Patient alert and agreeable to PT session. Patient denied pain during session. Patient reported feeling excited for having wires removed from his jaw and for upcoming d/c.   Therapeutic Activity: Bed Mobility: Patient performed supine to/from sit independently in a flat bed without use of bed rails. Transfers: Patient performed sit to/from stand throughout session independently from multiple household height surfaces.   Gait Training:  Patient ambulated >150 feet x2 without an AD with close supervision. Ambulated with narrow BOS, decreased DF and knee/hip flexion in swing, decreased gait speed and step length. Provided verbal cues for increased BOS and increased gait speed and arm swing to promote increased propulsion and foot clearance for improved balance with gait. He performed backwards walking and side-stepping R/L x30 ft with cues for increased step length and speed to promote increased muscle activation (hip extensors or abductors). Patient very tentative with walking backwards and required increased encouragement to continue with this task. He reports that he never walked backwards before and would not need to at d/c. Able to redirect patient to complete the task.  Neuromuscular Re-ed: Patient performed the following activities: -kicking a mesh ball into a wall while  alternating feet and performing side-stepping and balance strategies to accommodate changes in the balls trajectory 2x2-3 min HR 82-87 bpm -seated single digit multiplication, 4/4 correct -same task as above, kicking a ball, while answering single digit multiplication problems, 4/8 correct on the first response 4 required multiple attempts and patient stopped the activity to think about the correct answer due to increased dual task challenge  Patient reported need for rest breaks between activities due to "preventing light headedness." Patient denied symptoms throughout session, but perseverated on needing to rest "so it doesn't happen."  Patient in bed at end of session with breaks locked, bed alarm set, and all needs within reach.    Therapy Documentation Precautions:  Precautions Precautions: Fall,Other (comment),Sternal Precaution Comments: TBI, sternal, NG tube and full liquid diet Restrictions Weight Bearing Restrictions: No Other Position/Activity Restrictions: Sternal Precautions Agitated Behavior Scale: TBI Observation Details Observation Environment: CIR Start of observation period - Date: 09/24/20 Start of observation period - Time: 1103 End of observation period - Date: 09/24/20 End of observation period - Time: 1130 Agitated Behavior Scale (DO NOT LEAVE BLANKS) Short attention span, easy distractibility, inability to concentrate: Present to a slight degree Impulsive, impatient, low tolerance for pain or frustration: Absent Uncooperative, resistant to care, demanding: Absent Violent and/or threatening violence toward people or property: Absent Explosive and/or unpredictable anger: Absent Rocking, rubbing, moaning, or other self-stimulating behavior: Absent Pulling at tubes, restraints, etc.: Absent Wandering from treatment areas: Absent Restlessness, pacing, excessive movement: Absent Repetitive behaviors, motor, and/or verbal: Absent Rapid, loud, or excessive talking:  Absent Sudden changes of mood: Absent Easily initiated or excessive crying and/or laughter: Absent Self-abusiveness, physical and/or verbal: Absent Agitated behavior scale total  score: 15    Therapy/Group: Individual Therapy  Oceane Fosse L Ladarius Seubert PT, DPT  09/24/2020, 4:57 PM
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
## 2020-09-25 LAB — GLUCOSE, CAPILLARY
Glucose-Capillary: 118 mg/dL — ABNORMAL HIGH (ref 70–99)
Glucose-Capillary: 145 mg/dL — ABNORMAL HIGH (ref 70–99)
Glucose-Capillary: 97 mg/dL (ref 70–99)
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
## 2020-09-25 NOTE — Progress Notes (Signed)
Speech Language Pathology TBI Note  Patient Details  Name: Steve Mejia MRN: [id number] Date of Birth: 09/09/2000  Today's Date: 09/25/2020 SLP Individual Time: 1000-1055 SLP Individual Time Calculation (min): 55 min  Short Term Goals: Week 3: SLP Short Term Goal 1 (Week 3): Patient will self-monitor and correct verboisty/perseveration on topics with Mod A verbal and visual cues. SLP Short Term Goal 2 (Week 3): Patient will demonstrate functional problem solving for basic and familiar tasks with Min verbal cues. SLP Short Term Goal 3 (Week 3): Patient will demonstrate sustained attention to functional tasks for 45 minutes with Mod verbal cues for redirection.  Skilled Therapeutic Interventions: Skilled treatment session focused on cognitive goals. SLP facilitated session by providing supervision while patient ambulated to the bathroom. Patient was continent of urine. SLP facilitated session by providing supervision level verbal cues for problem solving during a mildly complex calendar task. Patient also utilized the computer to navigate the Internet to locate specific information with extra time and Min verbal cues. Patient left upright in bed with alarm on and all needs within reach. Continue with current plan of care.      Pain No/Denies Pain   Agitated Behavior Scale: TBI Observation Details Observation Environment: Patient's room Start of observation period - Date: 09/25/20 Start of observation period - Time: 1000 End of observation period - Date: 09/25/20 End of observation period - Time: 1055 Agitated Behavior Scale (DO NOT LEAVE BLANKS) Short attention span, easy distractibility, inability to concentrate: Present to a slight degree Impulsive, impatient, low tolerance for pain or frustration: Absent Uncooperative, resistant to care, demanding: Absent Violent and/or threatening violence toward people or property: Absent Explosive and/or unpredictable anger: Absent Rocking,  rubbing, moaning, or other self-stimulating behavior: Absent Pulling at tubes, restraints, etc.: Absent Wandering from treatment areas: Absent Restlessness, pacing, excessive movement: Absent Repetitive behaviors, motor, and/or verbal: Absent Rapid, loud, or excessive talking: Absent Sudden changes of mood: Absent Easily initiated or excessive crying and/or laughter: Absent Self-abusiveness, physical and/or verbal: Absent Agitated behavior scale total score: 15  Therapy/Group: Individual Therapy  Toluwani Yadav 09/25/2020, 3:52 PM
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
## 2020-09-26 DIAGNOSIS — S069X1S Unspecified intracranial injury with loss of consciousness of 30 minutes or less, sequela: Secondary | ICD-10-CM
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
## 2020-09-26 NOTE — Progress Notes (Signed)
Speech Language Pathology Weekly Progress and Session Note  Patient Details  Name: Steve Mejia MRN: [id number] Date of Birth: 12/29/2000  Beginning of progress report period: Sep 19, 2020 End of progress report period: Sep 26, 2020  Today's Date: 09/26/2020 SLP Individual Time: 9355-2174 SLP Individual Time Calculation (min): 45 min  Short Term Goals: Week 3: SLP Short Term Goal 1 (Week 3): Patient will self-monitor and correct verboisty/perseveration on topics with Mod A verbal and visual cues. SLP Short Term Goal 1 - Progress (Week 3): Met SLP Short Term Goal 2 (Week 3): Patient will demonstrate functional problem solving for basic and familiar tasks with Min verbal cues. SLP Short Term Goal 2 - Progress (Week 3): Met SLP Short Term Goal 3 (Week 3): Patient will demonstrate sustained attention to functional tasks for 45 minutes with Mod verbal cues for redirection. SLP Short Term Goal 3 - Progress (Week 3): Met    New Short Term Goals: Week 4: SLP Short Term Goal 1 (Week 4): STGs=LTGs due to ELOS  Weekly Progress Updates: Patient continues to make excellent gains and has met 3 of 3 STGs this reporting period. Currently, patient is demonstrating behaviors consistent with a Rancho Level VII and requires overall Min A verbal cues to complete mildly complex tasks safely in regards to awareness, attention, recall and problem solving. Patient and family education ongoing. Patient would benefit from continued skilled SLP intervention to maximize his cognitive functioning and overall functional independence prior to discharge.     Intensity: Minumum of 1-2 x/day, 30 to 90 minutes Frequency: 3 to 5 out of 7 days Duration/Length of Stay: 10/01/20 Treatment/Interventions: Cognitive remediation/compensation;Speech/Language facilitation;Cueing hierarchy;Functional tasks;Therapeutic Activities;Therapeutic Exercise;Medication managment;Patient/family education;Environmental controls   Daily  Session  Skilled Therapeutic Interventions: Pt seen for skilled ST with focus on cognitive goals. Pt participating in moderately complex divided attention task, benefiting from min verbal and visual cues to increase accuracy. SLP and patient discussing safe decision making for home environment, including energy conservation and utilizing family for assist as needed. Pt requires supervision level assist for mildly complex problem solving related to home environment. SLP and pt reviewing memory book and adding note for ST session. Pt left in bed with alarm set and all needs within reach. Cont ST POC.       Pain Pain Assessment Pain Scale: 0-10 Pain Score: 0-No pain  Therapy/Group: Individual Therapy  Dewaine Conger 09/26/2020, 1:36 PM

## 2020-09-26 NOTE — Progress Notes (Signed)
Patient ID: Steve Mejia, male   DOB: 02/10/2001, 20 y.o.   MRN: [id number]  SW spoke with pt mother [name] [phone]) to discuss family education. Reports herself, grandmother Aram Beecham, aunt Debarah Crape and stepmother Clance Boll will all be here on Tuesday (5/17) anytime between 8am-12pm/1p-3pm. SW advised there will be gaps in the schedule, and to look at his schedule on Monday evening for scheduled times.  SW will continue to follow.   Cecile Sheerer, MSW, LCSWA Office: [phone] Cell: [phone] Fax: [phone]
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
## 2020-09-26 NOTE — Progress Notes (Signed)
Occupational Therapy Weekly Progress Note  Patient Details  Name: Steve Mejia MRN: [id number] Date of Birth: 10/27/2000  Beginning of progress report period: September 06, 2020 End of progress report period: Sep 26, 2020  Today's Date: 09/26/2020 Session 1 OT Individual Time: 0740-0805 OT Individual Time Calculation (min): 25 min   Session 2 OT Individual Time: 1100-1200 OT Individual Time Calculation (min): 60 min    Patient has met 3 of 3 short term goals.  Patient has made great progress towards OT goals this week. Pt is at an overall supervision/CGA level for BADL tasks. Pt continues to demonstrate some safety awareness deficits within functional tasks as well as balance deficits, making him still at risk of falls. Continue current POC.  Patient continues to demonstrate the following deficits: muscle weakness, decreased attention, decreased awareness, decreased problem solving, decreased safety awareness and decreased memory and decreased standing balance, decreased postural control and decreased balance strategies and therefore will continue to benefit from skilled OT intervention to enhance overall performance with BADL.  Patient progressing toward long term goals..  Continue plan of care.  OT Short Term Goals Week 3:  OT Short Term Goal 1 (Week 3): Patient will maintain standing balance at the si ufjnk with CGA OT Short Term Goal 1 - Progress (Week 3): Met OT Short Term Goal 2 (Week 3): Patient will only require min verbal cues for safety awareness within BADL task OT Short Term Goal 2 - Progress (Week 3): Met OT Short Term Goal 3 (Week 3): Patient will maintain attention to task with min questioning cues OT Short Term Goal 3 - Progress (Week 3): Met Week 4:  OT Short Term Goal 1 (Week 4): LTG=STG 2/2 ELOS  Skilled Therapeutic Interventions/Progress Updates:    Session 1 Pt greeted semi-reclined in bed awake and agreeable to OT treatment session. Pt ambulated to the therapy  gym with close supervision and CGA when turning into doorway. Worked on static and dynamic standing balance with [employer] activity. First completed testing to find center of gravity, then worked on static balance strategies to complete maze, weight shifting in all 4 quadrants. Progressed to dynamic activity with more focus on hip and ankle strategies for balance. Dual task and balance activity walking in hallway balancing ball on disk. Pt returned to room and left semi-reclined in bed with bed alarm on, call bell in reach, and needs met.   Session 2 Pt greeted semi-reclined in bed and agreeable to OT treatment session focused on community reintegration and functional balance.  Pt was able to ambulate without AD and overall supervision to access elevator and community. Pt with with only a few overt LOB throughout session that she could not self-correct.  Pt able to purchase "pop-it" appropriately. Addressed safe community access, barriers to mobility, public restroom access/transfes, and energy conservation techniques. Worked on problem solving and visual scanning to order [employer] on App. Min cues throughout. Pt returned rehab floor and worked on functional balance with heel-toe walking on line. Pt returned to room and left semi-reclined in bed with bed alarm on, call bell in reach and needs met.   Therapy Documentation Precautions:  Precautions Precautions: Fall,Other (comment),Sternal Precaution Comments: TBI, sternal, NG tube and full liquid diet Restrictions Weight Bearing Restrictions: No Other Position/Activity Restrictions: Sternal Precautions Pain: Pt reports mild chest pain today,  Rest and repositioned for pain management  Therapy/Group: Individual Therapy   S  09/26/2020, 12:47 PM   

## 2020-09-27 NOTE — Plan of Care (Signed)
  Problem: Safety: Goal: Non-violent Restraint(s) Outcome: Progressing   Problem: Consults Goal: RH BRAIN INJURY PATIENT EDUCATION Description: Description: See Patient Education module for eduction specifics Outcome: Progressing Goal: Skin Care Protocol Initiated - if Braden Score 18 or less Description: If consults are not indicated, leave blank or document N/A Outcome: Progressing   Problem: RH BOWEL ELIMINATION Goal: RH STG MANAGE BOWEL WITH ASSISTANCE Description: STG Manage Bowel with Supervision Assistance. Outcome: Progressing Goal: RH STG MANAGE BOWEL W/MEDICATION W/ASSISTANCE Description: STG Manage Bowel with Medication with Supervision Assistance. Outcome: Progressing   Problem: RH BLADDER ELIMINATION Goal: RH STG MANAGE BLADDER WITH ASSISTANCE Description: STG Manage Bladder With Supervision Assistance Outcome: Progressing Goal: RH STG MANAGE BLADDER WITH MEDICATION WITH ASSISTANCE Description: STG Manage Bladder With Medication With Supervision Assistance. Outcome: Progressing   Problem: RH SKIN INTEGRITY Goal: RH STG ABLE TO PERFORM INCISION/WOUND CARE W/ASSISTANCE Description: STG Able To Perform Incision/Wound Care With Supervision Assistance. Outcome: Progressing   Problem: RH SAFETY Goal: RH STG ADHERE TO SAFETY PRECAUTIONS W/ASSISTANCE/DEVICE Description: STG Adhere to Safety Precautions With Supervision Assistance/Device. Outcome: Progressing Goal: RH STG DECREASED RISK OF FALL WITH ASSISTANCE Description: STG Decreased Risk of Fall With Assistance. Outcome: Progressing   Problem: RH COGNITION-NURSING Goal: RH STG USES MEMORY AIDS/STRATEGIES W/ASSIST TO PROBLEM SOLVE Description: STG Uses Memory Aids/Strategies With Cues and Reminders to Problem Solve. Outcome: Progressing Goal: RH STG ANTICIPATES NEEDS/CALLS FOR ASSIST W/ASSIST/CUES Description: STG Anticipates Needs/Calls for Assist With Cues and Reminders. Outcome: Progressing   Problem: RH  PAIN MANAGEMENT Goal: RH STG PAIN MANAGED AT OR BELOW PT'S PAIN GOAL Description: <4 on a 0-10 pain scale. Outcome: Progressing   Problem: RH KNOWLEDGE DEFICIT BRAIN INJURY Goal: RH STG INCREASE KNOWLEDGE OF SELF CARE AFTER BRAIN INJURY Description: Patient will demonstrate knowledge of medication management, dietary management, weight bearing precautions, skin/wound care with educational materials and handouts provided by staff, at discharge independently. Outcome: Progressing

## 2020-09-27 NOTE — Progress Notes (Signed)
Occupational Therapy Session Note  Patient Details  Name: Steve Mejia MRN: [id number] Date of Birth: [birth year]  Today's Date: 09/27/2020 OT Group Time: 1100-1200 OT Group Time Calculation (min): 60 min    Short Term Goals: Week 1:  OT Short Term Goal 1 (Week 1): Pt will complete toilet transfer with 1 assist and LRAD to progress with OOB toileting OT Short Term Goal 1 - Progress (Week 1): Met OT Short Term Goal 2 (Week 1): Pt will complete sit<stand with Min A during LB dressing during 2 consecutive OT sessions OT Short Term Goal 2 - Progress (Week 1): Met OT Short Term Goal 3 (Week 1): Pt will complete 1 grooming tasks at the sink with min cues for attention, sequencing, and awareness OT Short Term Goal 3 - Progress (Week 1): Met  Skilled Therapeutic Interventions/Progress Updates:    Pt participated in rhythmic drumming group. Pain no pain in chest. Focus of group on BUE coordination, strengthening, endurance, timing/control, activity tolerance, and social participation and engagement. Pt performs session from seated position for energy conservation. Skilled interventions included keeping movements inside "the tube" and self initiated rest breaks with encouragement as needed. Warm up performed prior to exercises and UB stretching completed at end of group with demo from OT. Pt able to select preferred song to share with group. Returned pt to room at end of session. Exited session with pt seated in bed, exit alarm on and call light in reach  Therapy Documentation Precautions:  Precautions Precautions: Fall,Other (comment),Sternal Precaution Comments: TBI, sternal, NG tube and full liquid diet Restrictions Weight Bearing Restrictions: No Other Position/Activity Restrictions: Sternal Precautions General:   Vital Signs: Therapy Vitals Temp: 98.4 F (36.9 C) Pulse Rate: 83 Resp: 18 BP: (!) 90/58 Patient Position (if appropriate): Lying Oxygen Therapy SpO2: 100 % O2 Device:  Room Air Pain:   ADL: ADL Eating: Not assessed Grooming: Minimal assistance Where Assessed-Grooming: Sitting at sink Upper Body Bathing: Not assessed Lower Body Bathing: Not assessed Upper Body Dressing: Minimal assistance Where Assessed-Upper Body Dressing: Sitting at sink Lower Body Dressing: Moderate assistance Where Assessed-Lower Body Dressing: Standing at sink,Sitting at sink Toileting: Maximal assistance Where Assessed-Toileting: Bed level (urinal) Toilet Transfer: Not assessed Tub/Shower Transfer: Not assessed Vision   Perception    Praxis   Exercises:   Other Treatments:     Therapy/Group: Individual Therapy  Tonny Branch 09/27/2020, 6:51 AM
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
## 2020-09-27 NOTE — Progress Notes (Signed)
Speech Language Pathology Daily Session Note  Patient Details  Name: ABDIRIZAK RICHISON MRN: [id number] Date of Birth: 2000-10-16  Today's Date: 09/27/2020 SLP Individual Time: 4098-1191 SLP Individual Time Calculation (min): 43 min  Short Term Goals: Week 4: SLP Short Term Goal 1 (Week 4): STGs=LTGs due to ELOS  Skilled Therapeutic Interventions: Pt seen for skilled ST with focus on cognitive goals. Pt states he is sleepy this morning d/t not sleeping well last night due to pain. SLP facilitating complex written direction task by providing supervision level cues for 100% accuracy. Pt demonstrates increased ability to self monitor and correct errors. SLP and patient completing memory notebook to increase recall and carryover of daily therapeutic tasks. Pt left in bed with alarm set and all needs within reach. Cont ST POC.     Pain Pain Assessment Pain Scale: 0-10 Pain Score: 0-No pain  Therapy/Group: Individual Therapy  Tacey Ruiz 09/27/2020, 10:00 AM

## 2020-09-28 NOTE — Progress Notes (Signed)
Physical Therapy Session Note  Patient Details  Name: Steve Mejia MRN: [id number] Date of Birth: 04/29/2001  Today's Date: 09/28/2020 PT Individual Time: 0902-1000 and 1430-1500 PT Individual Time Calculation (min): 58 min and 30 min.  Short Term Goals: Week 3:  PT Short Term Goal 1 (Week 3): Pt will ambulate 300' with CGA. PT Short Term Goal 1 - Progress (Week 3): Met PT Short Term Goal 2 (Week 3): Pt will perform bed to chair transfer consistently with CGA. PT Short Term Goal 2 - Progress (Week 3): Met PT Short Term Goal 3 (Week 3): Pt will not attempt to stand from chair prior to being cued, demonstrating improvement in impulsivity. PT Short Term Goal 3 - Progress (Week 3): Met  Skilled Therapeutic Interventions/Progress Updates:   First session:  Pt presents supine in bed asleep and easily aroused.  Pt agreeable to therapy, states encouragement w/ progress.  Pt transferred to sitting EOB w/ independence.and then dons socks and shirt w/ mod I, increased time.  Pt amb into BR to get washcloth and then to sink and washes face.  Pt transfers sit to stand w/ supervision and amb to main gym w/o AD and supervision.  Pt performed obstacle courses, stepping over canes, tapping cone w/ alternating LEs during gait, walking up ramp/mulch, walking on cushioned surfaces, standing on [employer] cushion putting T-ball overhead and tandem gait.  Pt performed floor transfer w/ supervision straight from floor.  Pt educated on use of furniture to transfer for safety.  Pt amb multiple trials > 500' performing marching as well as finding items on walls side to side w/ questions from PT.  Pt returned to room and sat EOB, but then wanted o lie down in bed.  Bed alarm on and all needs in reach.  Second session:  Pt presents semi-reclined in bed eating soup and salad.  Pt agreeable to therapy.  Pt transfers sit to stand w/ supervision.  Pt searches table to for mask and cleans up containers from lunch and carries to  trash can at sink.  Pt amb > 300' to elevators and amb on, managing controls.  Pt amb outside pushing open door w/ supervision.  Pt amb outside and negotiates ramped surfaces up to 300', including turns and back up ramped sidewalk.  Pt exhibits no LOB or fatigue.  Pt transfers sit <> stand from bench w/ supervision.  Pt amb back to room including negotiating elevator w/ supervision.  Pt returned to supine on bed w/ independence.  Bed alarm on and all needs in reach.     Therapy Documentation Precautions:  Precautions Precautions: Fall,Other (comment),Sternal Precaution Comments: TBI, sternal, NG tube and full liquid diet Restrictions Weight Bearing Restrictions: No Other Position/Activity Restrictions: Sternal Precautions General:   Vital Signs:   Pain: Pain Assessment Pain Scale: 0-10 Pain Score: 0-No pain Mobility:   Locomotion :    Trunk/Postural Assessment :    Balance:   Exercises:   Other Treatments:      Therapy/Group: Individual Therapy  Steve Mejia 09/28/2020, 11:00 AM

## 2020-09-29 LAB — BASIC METABOLIC PANEL
Anion gap: 7 (ref 5–15)
BUN: 10 mg/dL (ref 6–20)
CO2: 31 mmol/L (ref 22–32)
Calcium: 9.5 mg/dL (ref 8.9–10.3)
Chloride: 100 mmol/L (ref 98–111)
Creatinine, Ser: 1.01 mg/dL (ref 0.61–1.24)
GFR, Estimated: >60 mL/min (ref >60)
Glucose, Bld: 112 mg/dL — ABNORMAL HIGH (ref 70–99)
Potassium: 3.8 mmol/L (ref 3.5–5.1)
Sodium: 138 mmol/L (ref 135–145)

## 2020-09-29 LAB — CBC
HCT: 40.1 % (ref 39.0–52.0)
Hemoglobin: 12.7 g/dL — ABNORMAL LOW (ref 13.0–17.0)
MCH: 28 pg (ref 26.0–34.0)
MCHC: 31.7 g/dL (ref 30.0–36.0)
MCV: 88.3 fL (ref 80.0–100.0)
Platelets: 173 10*3/uL (ref 150–400)
RBC: 4.54 MIL/uL (ref 4.22–5.81)
RDW: 13.8 % (ref 11.5–15.5)
WBC: 4.1 10*3/uL (ref 4.0–10.5)
nRBC: 0 % (ref 0.0–0.2)

## 2020-09-29 LAB — VALPROIC ACID LEVEL: Valproic Acid Lvl: 52 ug/mL (ref 50.0–100.0)

## 2020-09-29 NOTE — Progress Notes (Signed)
Speech Language Pathology TBI Note  Patient Details  Name: Steve Mejia MRN: [id number] Date of Birth: 09/20/2000  Today's Date: 09/29/2020 SLP Individual Time: 4098-1191 SLP Individual Time Calculation (min): 55 min  Short Term Goals: Week 4: SLP Short Term Goal 1 (Week 4): STGs=LTGs due to ELOS  Skilled Therapeutic Interventions: Skilled treatment session focused on cognitive goals. SLP facilitated session by readministering the [facility] Mental Status Examination (SLUMS). Patient scored  27/30 points with a score of 27 or above considered normal. SLP also facilitated session by providing educational handouts for family education tomorrow. Patient reported he will put the handouts in his memory notebook for easier recall to locate them tomorrow when needed.  SLP provided education regarding d/c planning and generating a verbal list of activities the patient can participate in safely at home to maximize cognitive functioning and overall independence. Patient ambulated to and from the SLP office with supervision. Patient left upright in bed with alarm on and all needs within reach. Continue with current plan of care.    Pain Pain Assessment Pain Scale: 0-10 Pain Score: 0-No pain  Agitated Behavior Scale: TBI Observation Details Observation Environment: SLP office Start of observation period - Date: 09/29/20 Start of observation period - Time: 0830 End of observation period - Date: 09/29/20 End of observation period - Time: 0925 Agitated Behavior Scale (DO NOT LEAVE BLANKS) Short attention span, easy distractibility, inability to concentrate: Present to a slight degree Impulsive, impatient, low tolerance for pain or frustration: Absent Uncooperative, resistant to care, demanding: Absent Violent and/or threatening violence toward people or property: Absent Explosive and/or unpredictable anger: Absent Rocking, rubbing, moaning, or other self-stimulating behavior:  Absent Pulling at tubes, restraints, etc.: Absent Wandering from treatment areas: Absent Restlessness, pacing, excessive movement: Absent Repetitive behaviors, motor, and/or verbal: Absent Rapid, loud, or excessive talking: Absent Sudden changes of mood: Absent Easily initiated or excessive crying and/or laughter: Absent Self-abusiveness, physical and/or verbal: Absent Agitated behavior scale total score: 15  Therapy/Group: Individual Therapy  Steve Mejia 09/29/2020, 12:03 PM

## 2020-09-29 NOTE — Progress Notes (Signed)
Speech Language Pathology Discharge Summary  Patient Details  Name: Steve Mejia MRN: [id number] Date of Birth: 12/04/2000  Today's Date: 09/30/2020 SLP Individual Time: 0915-1005 SLP Individual Time Calculation (min): 50 min   Skilled Therapeutic Interventions:  Pt seen this date with focus on cognitive goals, family education and discharge planning. Pt's mother and step mother present for education. SLP providing education regarding ongoing Dys 1 diet with handout of acceptable food options for home. Reviewed cognitive checklist for encouraging patient independence with daily routine while providing assistance with higher level cognitive tasks as needed. Family agreeable that patient needs 24/7 supervision at discharge and assistance with medication and money management. Discussed safety precautions in d/c environment (grandmother's house) to prevent injury and promote health. Family will be transporting patient to outpatient therapies. All questions answered to satisfaction. Recommend d/c from skilled ST with OP ST recommendations.  Patient has met 7 of 7 long term goals.  Patient to discharge at overall Supervision;Min level.   Reasons goals not met: N/A   Clinical Impression/Discharge Summary: Patient has made excellent gains and has met 7 of 7 LTGs this admission. Currently, patient demonstrates behaviors consistent with a [place]o level VII and requires overall supervision-Min A verbal cues to complete functional and mildly complex tasks safely in regards to problem solving, attention, recall and awareness. Patient and family education is complete and patient will discharge home with 24 hour supervision from family. Patient would benefit from f/u SLP services to maximize his cognitive functioning and overall functional independence in order to reduce caregiver burden.   Care Partner:  Caregiver Able to Provide Assistance: Yes  Type of Caregiver Assistance:  Physical;Cognitive  Recommendation:  Outpatient SLP;24 hour supervision/assistance  Rationale for SLP Follow Up: Reduce caregiver burden;Maximize cognitive function and independence   Equipment: N/A   Reasons for discharge: Discharged from hospital;Treatment goals met   Patient/Family Agrees with Progress Made and Goals Achieved: Yes    PAYNE, COURTNEY 09/29/2020, 12:06 PM    
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
## 2020-09-29 NOTE — Progress Notes (Signed)
Physical Therapy Session Note  Patient Details  Name: Steve Mejia MRN: [id number] Date of Birth: Nov 09, 2000  Today's Date: 09/29/2020 PT Individual Time: 1530-1600 PT Individual Time Calculation (min): 30 min   Short Term Goals: Week 4:  PT Short Term Goal 1 (Week 4): STGs = LTGs due to ELOS  Skilled Therapeutic Interventions/Progress Updates:    Patient in supine asleep, but easily aroused.  Reports feels his medication makes him sleep.  Patient S for all mobility throughout session including supine to sit and to don shirt and socks.  Sit to stand and ambulation to therapy gym.  In gym performing high level balance and coordination activities to include side shuffles, braiding, then with increased speed, high stepping then with increased speed.  Squat jumps with green weighted ball x 10, tandem walking around edge of raised curb with min A.  In parallel bars on BOSU performed ball taps alternating feet.  On level tile performed ball hops x 20.  Patient ambulated to room and returned to supine.  Left in bed with alarm active and needs/call bell in reach.  Therapy Documentation Precautions:  Precautions Precautions: Fall,Other (comment),Sternal Precaution Comments: TBI, sternal, NG tube and full liquid diet Restrictions Weight Bearing Restrictions: No Other Position/Activity Restrictions: Sternal Precautions General:   Pain: Pain Assessment Pain Score: 0-No pain   Therapy/Group: Individual Therapy  Elray Mcgregor  Sheran Lawless, PT 09/29/2020, 3:56 PM

## 2020-09-29 NOTE — Progress Notes (Signed)
Patient ID: Steve Mejia, male   DOB: 2001-05-08, 20 y.o.   MRN: [id number]  SW returned phone call to pt mother [name] [phone]) who was inquiring about FMLA forms that need to be completed, but still waiting on HR to send forms. SW encouraged her to reach out to employer for forms so forms can be completed while pt in hospital.   Cecile Sheerer, MSW, LCSWA Office: [phone] Cell: [phone] Fax: [phone]

## 2020-09-29 NOTE — Progress Notes (Addendum)
Physical Therapy Session Note  Patient Details  Name: Steve Mejia MRN: [id number] Date of Birth: 2000/09/13  Today's Date: 09/29/2020 PT Individual Time: 7829-5621 PT Individual Time Calculation (min): 27 min   Short Term Goals: Week 4:  PT Short Term Goal 1 (Week 4): STGs = LTGs due to ELOS  Skilled Therapeutic Interventions/Progress Updates:     Patient in bed asleep upon PT arrival. Patient difficult to arouse initially due to deep sleep, opened blinds, turned on lights and patient aroused to tactile cues on second attempt. Patient reported poor sleep quality due to frequent interruptions throughout the night. Patient agreeable to PT session. Patient denied pain during session.  Focused session on patient education on signs and symptoms post TBI, coping strategies, management of environment, impact of stimulating environments, maintain a daily routine, emphasizing night-time sleeping and reducing day-time napping, emotional dysregulation, fall risk and prevention, calling emergency services in the event of a fall, impact and consequences of second TBI, consequences of tobacco and marijuana use, awareness of changes in sexual arousal and communication with MDs about returning to sexual activity due to TBI and heart surgery, and providing time for quiet relaxation to manage fatigue and stimulation at d/c. Patient appropriate and engaged/attentive in all education and discussions throughout session. Patient appreciative of education. He expressed concerns about his scars on his chest, advised use of scar cream and gentle scar massage to reduce keloid scaring and improved appearance. Discussed positive self-image and coping strategies. Patient reports he plans to take is slow and return to his church and faith at d/c to help him recover.   Patient stood, ambulated across the room to throw away a cup of lemonade with supervision for safety/balance during session.   Patient sitting up in bed at  end of session with breaks locked, bed alarm set, and all needs within reach. Encouraged patient to sit up and stay awake to improve night-time sleep, patient in agreement.   Therapy Documentation Precautions:  Precautions Precautions: Fall,Other (comment),Sternal Precaution Comments: TBI, sternal, NG tube and full liquid diet Restrictions Weight Bearing Restrictions: No Other Position/Activity Restrictions: Sternal Precautions Agitated Behavior Scale: TBI Observation Details Observation Environment: Patient's room Start of observation period - Date: 09/29/20 Start of observation period - Time: 1405 End of observation period - Date: 09/29/20 End of observation period - Time: 1432 Agitated Behavior Scale (DO NOT LEAVE BLANKS) Short attention span, easy distractibility, inability to concentrate: Absent Impulsive, impatient, low tolerance for pain or frustration: Absent Uncooperative, resistant to care, demanding: Absent Violent and/or threatening violence toward people or property: Absent Explosive and/or unpredictable anger: Absent Rocking, rubbing, moaning, or other self-stimulating behavior: Absent Pulling at tubes, restraints, etc.: Absent Wandering from treatment areas: Absent Restlessness, pacing, excessive movement: Absent Repetitive behaviors, motor, and/or verbal: Absent Rapid, loud, or excessive talking: Absent Sudden changes of mood: Absent Easily initiated or excessive crying and/or laughter: Absent Self-abusiveness, physical and/or verbal: Absent Agitated behavior scale total score: 14   Therapy/Group: Individual Therapy  Lolah Coghlan L Ita Fritzsche PT, DPT  09/29/2020, 4:02 PM

## 2020-09-29 NOTE — Progress Notes (Signed)
Occupational Therapy TBI Note  Patient Details  Name: Steve Mejia MRN: [id number] Date of Birth: August 19, 2000  Today's Date: 09/29/2020 OT Individual Time: 1100-1200 OT Individual Time Calculation (min): 60 min    Short Term Goals: Week 4:  OT Short Term Goal 1 (Week 4): LTG=STG 2/2 ELOS  Skilled Therapeutic Interventions/Progress Updates:    Pt greeted semi-reclined in bed and agreeable to OT treatment session. Pt wanted to shower. Pt donned waterproof shampoo cap, then ambulated in room to collect clothing with supervision. Bathing completed sit<>stand from shower with overall supervision and overall improved safety awareness. Dressing from EOB Mod/I/supervision. Pt ambulated to collect clothing in room w/ supervision and brought to laundry room. Pt loaded top loader washer and appropriately set washing machine with supervision. Pt then ambulated to therapy gym and worked on Air cabin crew. Started with up to 8 numbers in sequence, progressing to 8 words. Good memory strategies saying the sequence outloud. Pt returned to room and left semi-reclined in bed with bed alarm on, call bell in reach, and needs met.   Therapy Documentation Precautions:  Precautions Precautions: Fall,Other (comment),Sternal Precaution Comments: TBI, sternal, NG tube and full liquid diet Restrictions Weight Bearing Restrictions: No Other Position/Activity Restrictions: Sternal Precautions \Pain:  denies pain Agitated Behavior Scale: TBI Observation Details Observation Environment: room Start of observation period - Date: 09/29/20 Start of observation period - Time: 1100 End of observation period - Date: 09/29/20 End of observation period - Time: 1200 Agitated Behavior Scale (DO NOT LEAVE BLANKS) Short attention span, easy distractibility, inability to concentrate: Present to a slight degree Impulsive, impatient, low tolerance for pain or frustration: Absent Uncooperative, resistant to care,  demanding: Absent Violent and/or threatening violence toward people or property: Absent Explosive and/or unpredictable anger: Absent Rocking, rubbing, moaning, or other self-stimulating behavior: Absent Pulling at tubes, restraints, etc.: Absent Wandering from treatment areas: Absent Restlessness, pacing, excessive movement: Absent Repetitive behaviors, motor, and/or verbal: Absent Rapid, loud, or excessive talking: Absent Sudden changes of mood: Absent Easily initiated or excessive crying and/or laughter: Absent Self-abusiveness, physical and/or verbal: Absent Agitated behavior scale total score: 15  Therapy/Group: Individual Therapy  Valma Cava 09/29/2020, 1:02 PM

## 2020-09-30 NOTE — Plan of Care (Signed)
  Problem: RH Balance Goal: LTG Patient will maintain dynamic standing with ADLs (OT) Description: LTG:  Patient will maintain dynamic standing balance with assist during activities of daily living (OT)  Outcome: Completed/Met   Problem: Sit to Stand Goal: LTG:  Patient will perform sit to stand in prep for activites of daily living with assistance level (OT) Description: LTG:  Patient will perform sit to stand in prep for activites of daily living with assistance level (OT) Outcome: Completed/Met   Problem: RH Grooming Goal: LTG Patient will perform grooming w/assist,cues/equip (OT) Description: LTG: Patient will perform grooming with assist, with/without cues using equipment (OT) Outcome: Completed/Met   Problem: RH Bathing Goal: LTG Patient will bathe all body parts with assist levels (OT) Description: LTG: Patient will bathe all body parts with assist levels (OT) Outcome: Completed/Met   Problem: RH Dressing Goal: LTG Patient will perform upper body dressing (OT) Description: LTG Patient will perform upper body dressing with assist, with/without cues (OT). Outcome: Completed/Met Goal: LTG Patient will perform lower body dressing w/assist (OT) Description: LTG: Patient will perform lower body dressing with assist, with/without cues in positioning using equipment (OT) Outcome: Completed/Met   Problem: RH Toilet Transfers Goal: LTG Patient will perform toilet transfers w/assist (OT) Description: LTG: Patient will perform toilet transfers with assist, with/without cues using equipment (OT) Outcome: Completed/Met   Problem: RH Tub/Shower Transfers Goal: LTG Patient will perform tub/shower transfers w/assist (OT) Description: LTG: Patient will perform tub/shower transfers with assist, with/without cues using equipment (OT) Outcome: Completed/Met

## 2020-09-30 NOTE — Progress Notes (Signed)
Occupational Therapy Discharge Summary  Patient Details  Name: Steve Mejia MRN: [id number] Date of Birth: May 08, 2001  Today's Date: 09/30/2020 OT Individual Time: 1103-1200 OT Individual Time Calculation (min): 57 min    OT treatment session focused on increased independence with BADL tasks. Pt completed bed mobilityand functional ambulation mod I. BADL tasks completed at the shower level with overall supervision/mod I with min cues for safety awareness. See below for further details on BADL performance. OT educated pt's mom and step-mom on safety modifications, BADL performance, balance, and general BI education. Reviewed sternal precautions and issued blue theraband. Educated on tricpes press and bicep curl with focus on keeping pull out of chest. Pt left semi-reclined in bed with needs met.    Patient has met 8 of 8 long term goals due to improved activity tolerance, improved balance, postural control, ability to compensate for deficits, improved attention, improved awareness and improved coordination.  Patient to discharge at overall Supervision level.  Patient's care partner is independent to provide the necessary physical and cognitive assistance at discharge.    Reasons goals not met: n/a  Recommendation:  Patient will benefit from ongoing skilled OT services in outpatient setting to continue to advance functional skills in the area of BADL.  Equipment: No equipment provided  Reasons for discharge: treatment goals met and discharge from hospital  Patient/family agrees with progress made and goals achieved: Yes  OT Discharge Precautions/Restrictions  Precautions Precautions: Fall Precaution Booklet Issued: Yes (comment) Precaution Comments: TBI, sternal, dysphagia 1 diet Restrictions Other Position/Activity Restrictions: Sternal Precautions Pain Pain Assessment Pain Scale: 0-10 Pain Score: 0-No pain ADL ADL Eating: Modified independent Grooming: Modified  independent Where Assessed-Grooming: Sitting at sink Upper Body Bathing: Modified independent Lower Body Bathing: Supervision/safety Upper Body Dressing: Modified independent (Device) Where Assessed-Upper Body Dressing: Sitting at sink Lower Body Dressing: Supervision/safety Where Assessed-Lower Body Dressing: Standing at sink,Sitting at sink Toileting: Modified independent Where Assessed-Toileting: Bed level (urinal) Toilet Transfer: Distant supervision Tub/Shower Transfer: Distant supervision Perception  Perception: Within Functional Limits Cognition Overall Cognitive Status: Impaired/Different from baseline Arousal/Alertness: Awake/alert Orientation Level: Oriented X4 Sustained Attention: Impaired Sustained Attention Impairment: Verbal basic;Functional basic Memory: Impaired Memory Impairment: Storage deficit;Retrieval deficit;Decreased recall of new information Immediate Memory Recall: Sock;Blue;Bed Memory Recall Sock: With Cue Memory Recall Blue: Without Cue Memory Recall Bed: With Cue Awareness: Impaired Awareness Impairment: Emergent impairment;Anticipatory impairment;Intellectual impairment Problem Solving: Impaired Problem Solving Impairment: Functional basic Behaviors: Impulsive (much improved since eval) Safety/Judgment: Impaired (much improved since eval) [employer] Scales of Cognitive Functioning: Purposeful/appropriate Sensation Sensation Light Touch: Appears Intact Coordination Gross Motor Movements are Fluid and Coordinated: Yes Fine Motor Movements are Fluid and Coordinated: Yes Mobility  Bed Mobility Bed Mobility: Rolling Right;Rolling Left;Right Sidelying to Sit;Sit to Supine (moves impulsively w/very poor safety awareness) Rolling Right: Independent Rolling Left: Independent Right Sidelying to Sit: Independent Sit to Supine: Independent Transfers Sit to Stand: Independent Stand to Sit: Independent  Trunk/Postural Assessment  Cervical  Assessment Cervical Assessment: Within Functional Limits Thoracic Assessment Thoracic Assessment: Within Functional Limits Lumbar Assessment Lumbar Assessment: Within Functional Limits  Balance Balance Balance Assessed: Yes Static Sitting Balance Static Sitting - Balance Support: Feet supported;Bilateral upper extremity supported Static Sitting - Level of Assistance: 7: Independent Dynamic Sitting Balance Dynamic Sitting - Balance Support: Feet unsupported;Feet supported Dynamic Sitting - Level of Assistance: 7: Independent (when doffing/donning gripper socks due to trunk control deficits and poor awareness of deficits) Static Standing Balance Static Standing - Balance Support: Bilateral upper extremity  supported;During functional activity Static Standing - Level of Assistance: 5: Stand by assistance Dynamic Standing Balance Dynamic Standing - Balance Support: During functional activity Dynamic Standing - Level of Assistance: 5: Stand by assistance Extremity/Trunk Assessment RUE Assessment RUE Assessment: Within Functional Limits LUE Assessment LUE Assessment: Within Functional Limits   Daneen Schick Cederic Mozley 09/30/2020, 12:56 PM

## 2020-09-30 NOTE — Progress Notes (Signed)
Patient ID: Steve Mejia, male   DOB: 03-13-2001, 20 y.o.   MRN: [id number]  Pt set up for [employer] medication assistance program.   SW met with pt and pt mother and aunt to provide updates on gains made here in rehab, d/c date continues to remain tomorrow, no DME recs, and rec for outpatient PT/OT/SLP. Preferred location is [facility].   SW faxed referral to [facility] (p:[phone]/[phone]).  Loralee Pacas, MSW, [place] Office: [phone] Cell: [phone] Fax: [phone]

## 2020-09-30 NOTE — Progress Notes (Signed)
Physical Therapy Discharge Summary  Patient Details  Name: Steve Mejia MRN: [id number] Date of Birth: 02-24-2001  Today's Date: 09/30/2020 PT Individual Time: 1305-1405 PT Individual Time Calculation (min): 60 min    Patient has met 9 of 9 long term goals due to improved activity tolerance, improved balance, improved postural control, increased strength, decreased pain, ability to compensate for deficits, improved attention, improved awareness and improved coordination.  Patient to discharge at an ambulatory level Supervision.   Patient's care partner is independent to provide the necessary physical and cognitive assistance at discharge.  Reasons goals not met: n/a  Recommendation:  Patient will benefit from ongoing skilled PT services in outpatient setting to continue to advance safe functional mobility, address ongoing impairments in balance, functional mobility, activity tolerance, gait and stair training, community integration, safety awareness, path finding, dual task training, and minimize fall risk.  Equipment: No equipment provided  Reasons for discharge: treatment goals met  Patient/family agrees with progress made and goals achieved: Yes  Skilled Therapeutic Intervention: Patient in bed with his mother and step-mother present for family education upon PT arrival. Patient alert and agreeable to PT session. Patient denied pain during session.  Provided education on recommendations for 24/7 supervision for safety due to cognitive and balance deficits at d/c and patient must be within arms reach with transfers and ambulation at all times, patient and family in agreement. Educated on deficits in attention, memory, and awareness limiting patient's balance and safety with all mobility at this time. Discussed continuum of care and goals for OOPT, educated on patient's progress and current level of function. Encouraged providing a consistent daily schedule to promote success with daily  activities. Educated on progressing from home environment to community environment at a slow pace (short outings to quiet places without crowds initially). Discussed safety concerns with driving, weapons in the home, and drinking, smoking, or illicit drug use and recommending avoiding all of these things at d/c for optimal recovery and safety. Educated on consequences of second TBI and cardiac issues related to life-style choices, patient and his family very receptive and stated goals to make changes to prevent further health risks. Also, educated on fall risk/prevention, home modifications to prevent falls, and activation of emergency services in the event of a fall during session.    Patient's family provided safe guarding with all mobility throughout session, following therapists cues and demonstration for technique.  Therapeutic Activity: Bed Mobility: Patient performed rolling R/L and supine to/from sit independently in a flat bed without use of bed rails. Transfers: Patient performed sit to/from stand from multiple household type surfaces independently throughout session. Patient performed a simulated sedan and SUV height car transfers with supervision. Provided cues for safe technique and guarding.  Gait Training:  Patient ambulated 100-300 feet x3 without AD with supervision for safety. Ambulated as described below. Provided verbal cues for increased BOS, awareness and visual scanning of environment in busier environments for safety. Patient ambulated up/down a ramp, over 10 feet of mulch (unlevel surface), and up/down a curb to simulate community ambulation over unlevel surfaces with supervision without an AD.  Patient ascended/descended 12 steps using R rail with supervision. Performed reciprocal gait pattern. Provided cues for technique and sequencing. Educated on safe guarding on downhill side of patient and assisting patient to sitting on the steps in the event of LOB.  Discussed exercises  patient has performed and provided HEP handout with patient selected exercises, to be performed with close supervision for safety: -braided walking  2x4 10 ft laps  -walking with a ball on a platform 2x4 10 ft laps -squat jumps 2x10 -progressive walking program starting at 5 min 2x/day and increasing by 1-2 min per week Educated patient and family on hx of "light headedness" with increased activity. Recommended assessing vitals (patient's mother is a Psychologist, sport and exercise) and keeping a log of symptoms and vitals to bring in to the doctor at follow-up.   Patient in bed with his family in the room at end of session with breaks locked, bed alarm set, and all needs within reach.   PT Discharge Precautions/Restrictions Precautions Precautions: Fall Precaution Booklet Issued: Yes (comment) Precaution Comments: TBI, sternal, dysphagia 1 diet Restrictions Other Position/Activity Restrictions: Sternal Precautions Vision/Perception  Vision - Assessment Eye Alignment: Within Functional Limits Alignment/Gaze Preference: Within Defined Limits Tracking/Visual Pursuits: Able to track stimulus in all quads without difficulty Saccades: Within functional limits Convergence: Within functional limits Perception Perception: Within Functional Limits Praxis Praxis: Impaired Praxis Impairment Details: Perseveration  Cognition Overall Cognitive Status: Impaired/Different from baseline Arousal/Alertness: Awake/alert Orientation Level: Oriented X4 Attention: Focused Focused Attention: Appears intact Sustained Attention: Impaired Sustained Attention Impairment: Verbal basic;Functional basic Memory: Impaired Memory Impairment: Storage deficit;Retrieval deficit;Decreased recall of new information Awareness: Impaired Awareness Impairment: Emergent impairment;Anticipatory impairment;Intellectual impairment Problem Solving: Impaired Problem Solving Impairment: Functional basic Behaviors:  Impulsive;Perseveration (much improved since eval) Safety/Judgment: Impaired (much improved since eval) [employer] Scales of Cognitive Functioning: Purposeful/appropriate Sensation Sensation Light Touch: Appears Intact Proprioception: Appears Intact Coordination Gross Motor Movements are Fluid and Coordinated: No Fine Motor Movements are Fluid and Coordinated: Yes Coordination and Movement Description: Decreased postural control Heel Shin Test: [facility] Motor  Motor Motor: Other (comment) Motor - Discharge Observations: Mild deficits in postural control and balance strategies  Mobility Bed Mobility Bed Mobility: Rolling Right;Rolling Left;Right Sidelying to Sit;Sit to Supine (moves impulsively w/very poor safety awareness) Rolling Right: Independent Rolling Left: Independent Right Sidelying to Sit: Independent Sit to Supine: Independent Transfers Transfers: Sit to Stand;Stand to Sit;Stand Pivot Transfers Sit to Stand: Independent Stand to Sit: Independent Stand Pivot Transfers: Supervision/Verbal cueing Stand Pivot Transfer Details: Verbal cues for precautions/safety Transfer (Assistive device): None Locomotion  Gait Ambulation: Yes Gait Assistance: Supervision/Verbal cueing Gait Distance (Feet): 1000 Feet Assistive device: None Gait Assistance Details: Verbal cues for precautions/safety;Verbal cues for technique Gait Gait: Yes Gait Pattern: Decreased stride length;Decreased trunk rotation;Narrow base of support Gait velocity: decreased Stairs / Additional Locomotion Stairs: Yes Stairs Assistance: Supervision/Verbal cueing Stair Management Technique: One rail Right Number of Stairs: 12 Height of Stairs: 6 Ramp: Supervision/Verbal cueing Curb: Supervision/Verbal cueing Wheelchair Mobility Wheelchair Mobility: No  Trunk/Postural Assessment  Cervical Assessment Cervical Assessment: Within Functional Limits Thoracic Assessment Thoracic Assessment: Within  Functional Limits Lumbar Assessment Lumbar Assessment: Within Functional Limits Postural Control Postural Control: Deficits on evaluation (mildly decreased/delayed)  Balance Balance Balance Assessed: Yes Static Sitting Balance Static Sitting - Balance Support: Feet supported Static Sitting - Level of Assistance: 7: Independent Dynamic Sitting Balance Dynamic Sitting - Balance Support: Feet unsupported;Feet supported Dynamic Sitting - Level of Assistance: 7: Independent (when doffing/donning gripper socks due to trunk control deficits and poor awareness of deficits) Static Standing Balance Static Standing - Balance Support: During functional activity;No upper extremity supported Static Standing - Level of Assistance: 5: Stand by assistance Dynamic Standing Balance Dynamic Standing - Balance Support: During functional activity;No upper extremity supported Dynamic Standing - Level of Assistance: 5: Stand by assistance Extremity Assessment  RLE Assessment RLE Assessment: Within Functional Limits Active Range  of Motion (AROM) Comments: WFL with all functional mobility General Strength Comments: 5/5 throughout in sitting LLE Assessment LLE Assessment: Within Functional Limits Active Range of Motion (AROM) Comments: WFL for all functional mobility General Strength Comments: 5/5 throughout in sitting    Amica Harron L Wen [place] PT, DPT  09/30/2020, 7:55 PM
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
## 2020-10-01 ENCOUNTER — Other Ambulatory Visit (HOSPITAL_COMMUNITY): Payer: Self-pay

## 2020-10-01 MED ORDER — OLANZAPINE 2.5 MG PO TABS
2.5000 mg | ORAL_TABLET | Freq: Every day | ORAL | 0 refills | Status: DC
  Filled 2020-10-01: qty 30, 30d supply, fill #0

## 2020-10-01 MED ORDER — BENZOCAINE 10 % MT GEL
Freq: Two times a day (BID) | OROMUCOSAL | 0 refills | Status: DC | PRN
  Filled 2020-10-01: qty 9, fill #0

## 2020-10-01 MED ORDER — METOPROLOL TARTRATE 25 MG PO TABS
25.0000 mg | ORAL_TABLET | Freq: Two times a day (BID) | ORAL | 0 refills | Status: DC
  Filled 2020-10-01: qty 60, 30d supply, fill #0

## 2020-10-01 MED ORDER — MUSCLE RUB 10-15 % EX CREA
1.0000 "application " | TOPICAL_CREAM | CUTANEOUS | 0 refills | Status: DC | PRN
  Filled 2020-10-01: qty 85, 14d supply, fill #0

## 2020-10-01 MED ORDER — ACETAMINOPHEN 325 MG PO TABS: 325.0000 mg | ORAL_TABLET | ORAL | Status: DC | PRN

## 2020-10-01 MED ORDER — OXYCODONE HCL 5 MG/5ML PO SOLN
5.0000 mg | Freq: Every day | ORAL | 0 refills | Status: DC | PRN
  Filled 2020-10-01: qty 70, 7d supply, fill #0

## 2020-10-01 MED ORDER — PANTOPRAZOLE SODIUM 40 MG PO PACK
40.0000 mg | PACK | Freq: Every day | ORAL | 0 refills | Status: DC
  Filled 2020-10-01: qty 600, 30d supply, fill #0

## 2020-10-01 MED ORDER — VALPROIC ACID 250 MG/5ML PO SOLN
500.0000 mg | Freq: Three times a day (TID) | ORAL | 0 refills | Status: DC
  Filled 2020-10-01: qty 946, 32d supply, fill #0

## 2020-10-01 MED ORDER — LEVETIRACETAM 100 MG/ML PO SOLN
500.0000 mg | Freq: Two times a day (BID) | ORAL | 0 refills | Status: DC
  Filled 2020-10-01: qty 300, 30d supply, fill #0

## 2020-10-01 MED ORDER — ACETAMINOPHEN 160 MG/5ML PO SUSP
650.0000 mg | Freq: Four times a day (QID) | ORAL | 0 refills | Status: DC
  Filled 2020-10-01: qty 118, 2d supply, fill #0

## 2020-10-01 NOTE — Discharge Instructions (Signed)
Inpatient Rehab Discharge Instructions  Steve Mejia Discharge date and time:  10/01/20  Activities/Precautions/ Functional Status: Activity: no lifting, driving, or strenuous exercise till cleared by MD Diet: Liquid diet or foods that are pureed  Wound Care: keep wound clean and dry Contact MD if you develop any problems with your incision/wound--redness, swelling, increase in pain, drainage or if you develop fever or chills.    Functional status:  ___ No restrictions     ___ Walk up steps independently _X__ 24/7 supervision/assistance   ___ Walk up steps with assistance ___ Intermittent supervision/assistance  ___ Bathe/dress independently ___ Walk with walker     ___ Bathe/dress with assistance ___ Walk Independently    ___ Shower independently _X__ Walk with assistance    _X__ Shower with assistance _X__ No alcohol     ___ Return to work/school ________   COMMUNITY REFERRALS UPON DISCHARGE:    Outpatient: PT    OT    ST             Agency:[facility]   Phone:[phone]              Appointment Date/Time: *Please expect follow-up within 7-10 business days to schedule the home visit. If you do not receive follow-up, be sure to contact the site directly.*   Special Instructions: 1.No alcohol.   2. Will need follow up with neurology to decide on duration/discontinuation of Keppra. 3. Pills need to be crushed and given in applesauce or pudding.   Per [facility] statutes, patients with seizures are not allowed to drive until  they have been seizure-free for six months. Use caution when using heavy equipment or power tools. Avoid working on ladders or at heights. Take showers instead of baths. Ensure the water temperature is not too high on the home water heater. Do not go swimming alone. When caring for infants or small children, sit down when holding, feeding, or changing them to minimize risk of injury to the child in the event you have a seizure.  Maintain good  sleep hygiene. Avoid alcohol.     My questions have been answered and I understand these instructions. I will adhere to these goals and the provided educational materials after my discharge from the hospital.  Patient/Caregiver Signature _______________________________ Date __________  Clinician Signature _______________________________________ Date __________  Please bring this form and your medication list with you to all your follow-up doctor's appointments.
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
## 2020-10-01 NOTE — Progress Notes (Signed)
Inpatient Rehabilitation Care Coordinator Discharge Note  The overall goal for the admission was met for:   Discharge location: Yes. D/c to his grandmother's home where he will have 24/7 care from various family members.   Length of Stay: Yes. 26 days.   Discharge activity level: Yes. Supervision.   Home/community participation: Yes.Limited.  Services provided included: MD, RD, PT, OT, SLP, RN, CM, TR, Pharmacy, Neuropsych and SW  Financial Services: Other: Uninsured  Choices offered to/list presented to:Yes  Follow-up services arranged: Outpatient: [facility] for outpatient PT/OT/SLP  Comments (or additional information):  Patient/Family verbalized understanding of follow-up arrangements: Yes  Individual responsible for coordination of the follow-up plan: contact pt mother Denny Peon [phone]  Confirmed correct DME delivered: Rana Snare 10/01/2020    Rana Snare
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
## 2020-10-02 ENCOUNTER — Telehealth: Payer: Self-pay | Admitting: *Deleted

## 2020-10-02 NOTE — Telephone Encounter (Signed)
Transitional care call  Spoke with mother Steve Mejia as directed on social work discharge note.  She was at work.  Appointment confirmed, date, time, location. Dr. Riley Kill,  Wednesday, June 1st, 2022 [address] [place], [place] [id number] Care Questions     1. Are you/is patient experiencing any problems since coming home? No  Are there any questions regarding any aspect of care? No   2. Are there any questions regarding medications administration/dosing? Patients mother asked why patient is still taking Keppra. I informed for seizure prevention. Otherwise no other questions.  Are meds being taken as prescribed? yes Patient should review meds with caller to confirm   3. Have there been any falls? No  4. Has Home Health been to the house and/or have they contacted you? No, straight to Neurorehab  If not, have you tried to contact them? Can we help you contact them?   5. Are bowels and bladder emptying properly? Yes  Are there any unexpected incontinence issues?  If applicable, is patient following bowel/bladder programs?   6. Any fevers, problems with breathing, unexpected pain?  No  7. Are there any skin problems or new areas of breakdown?  No  8. Has the patient/family member arranged specialty MD follow up (ie cardiology/neurology/renal/surgical/etc)? Yes Can we help arrange? No   9. Does the patient need any other services or support that we can help arrange? Not at the moment  10. Are caregivers following through as expected in assisting the patient? Yes  11. Has the patient quit smoking, drinking alcohol, or using drugs as recommended?  Mom says patient does not do these things

## 2020-10-09 DIAGNOSIS — Z298 Encounter for other specified prophylactic measures: Secondary | ICD-10-CM

## 2020-10-15 ENCOUNTER — Encounter: Payer: Self-pay | Admitting: Physical Medicine & Rehabilitation

## 2020-10-15 ENCOUNTER — Encounter: Payer: Medicaid Other | Attending: Physical Medicine & Rehabilitation | Admitting: Physical Medicine & Rehabilitation

## 2020-10-15 ENCOUNTER — Other Ambulatory Visit: Payer: Self-pay

## 2020-10-15 VITALS — BP 111/69 | HR 72 | Temp 98.6°F | Ht 74.0 in | Wt 167.2 lb

## 2020-10-15 DIAGNOSIS — S069X0S Unspecified intracranial injury without loss of consciousness, sequela: Secondary | ICD-10-CM | POA: Diagnosis present

## 2020-10-15 DIAGNOSIS — Z298 Encounter for other specified prophylactic measures: Secondary | ICD-10-CM | POA: Insufficient documentation

## 2020-10-15 DIAGNOSIS — S069X3S Unspecified intracranial injury with loss of consciousness of 1 hour to 5 hours 59 minutes, sequela: Secondary | ICD-10-CM | POA: Diagnosis not present

## 2020-10-15 DIAGNOSIS — R4689 Other symptoms and signs involving appearance and behavior: Secondary | ICD-10-CM | POA: Diagnosis present

## 2020-10-15 MED ORDER — DIVALPROEX SODIUM 500 MG PO DR TAB: 500.0000 mg | DELAYED_RELEASE_TABLET | Freq: Three times a day (TID) | ORAL | 3 refills | Status: DC

## 2020-10-15 MED ORDER — LEVETIRACETAM 500 MG PO TABS: 500.0000 mg | ORAL_TABLET | Freq: Two times a day (BID) | ORAL | 2 refills | Status: DC
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
## 2020-10-15 NOTE — Patient Instructions (Addendum)
PLEASE FEEL FREE TO CALL OUR OFFICE WITH ANY PROBLEMS OR QUESTIONS [phone])  STOP ZYPREXA   FOR 2 WEEKS CONTINUE DEPAKOTE 3 X DAILY, THEN DECREASE TO 2 X DAILY  CONTINUE KEPPRA FOR NOW ALSO    I WOULD WAIT ANOTHER 2 MONTHS BEFORE YOU HAVE YOUR EYES CHECKED AND GET A PRESCRIPTION FOR GLASSES

## 2020-10-16 ENCOUNTER — Ambulatory Visit: Payer: No Typology Code available for payment source | Admitting: Neurology

## 2020-10-20 ENCOUNTER — Ambulatory Visit: Payer: Self-pay | Admitting: Speech Pathology

## 2020-10-20 ENCOUNTER — Ambulatory Visit: Payer: Self-pay | Attending: Physician Assistant

## 2020-10-20 ENCOUNTER — Ambulatory Visit: Payer: Self-pay | Admitting: Occupational Therapy

## 2020-10-23 ENCOUNTER — Telehealth: Payer: Self-pay | Admitting: *Deleted

## 2020-10-23 NOTE — Telephone Encounter (Signed)
Dr Sue Lush Scholar called to talk with Dr Riley Kill about their mutual patient. She would like a call back.  Her number is [phone].

## 2020-10-24 NOTE — Telephone Encounter (Signed)
Returned call and left VM.

## 2020-12-31 ENCOUNTER — Encounter: Payer: Medicaid Other | Attending: Physical Medicine & Rehabilitation | Admitting: Physical Medicine & Rehabilitation

## 2020-12-31 ENCOUNTER — Encounter: Payer: Self-pay | Admitting: Physical Medicine & Rehabilitation

## 2020-12-31 ENCOUNTER — Other Ambulatory Visit: Payer: Self-pay

## 2020-12-31 VITALS — BP 117/75 | HR 60 | Temp 98.4°F | Ht 74.0 in | Wt 181.6 lb

## 2020-12-31 DIAGNOSIS — R4689 Other symptoms and signs involving appearance and behavior: Secondary | ICD-10-CM | POA: Diagnosis present

## 2020-12-31 DIAGNOSIS — R4189 Other symptoms and signs involving cognitive functions and awareness: Secondary | ICD-10-CM | POA: Diagnosis not present

## 2020-12-31 DIAGNOSIS — S069X0S Unspecified intracranial injury without loss of consciousness, sequela: Secondary | ICD-10-CM | POA: Insufficient documentation

## 2020-12-31 DIAGNOSIS — S069X3S Unspecified intracranial injury with loss of consciousness of 1 hour to 5 hours 59 minutes, sequela: Secondary | ICD-10-CM | POA: Diagnosis present

## 2020-12-31 DIAGNOSIS — Z09 Encounter for follow-up examination after completed treatment for conditions other than malignant neoplasm: Secondary | ICD-10-CM | POA: Insufficient documentation

## 2020-12-31 DIAGNOSIS — S0291XS Unspecified fracture of skull, sequela: Secondary | ICD-10-CM | POA: Insufficient documentation

## 2020-12-31 DIAGNOSIS — Z79899 Other long term (current) drug therapy: Secondary | ICD-10-CM | POA: Insufficient documentation

## 2020-12-31 DIAGNOSIS — G8929 Other chronic pain: Secondary | ICD-10-CM | POA: Insufficient documentation

## 2020-12-31 DIAGNOSIS — H538 Other visual disturbances: Secondary | ICD-10-CM | POA: Diagnosis not present

## 2020-12-31 DIAGNOSIS — S06899S Other specified intracranial injury with loss of consciousness of unspecified duration, sequela: Secondary | ICD-10-CM | POA: Insufficient documentation

## 2020-12-31 DIAGNOSIS — R561 Post traumatic seizures: Secondary | ICD-10-CM | POA: Diagnosis not present

## 2020-12-31 NOTE — Patient Instructions (Addendum)
Depakote: 500mg  at night for 1 week then stop.   In one month, decrease keppra to once daily for 2 weeks then stop.   After stopping above medications AND HAVING YOUR EYES CHECKED............. RETURN TO DRIVING PLAN:  WITH THE SUPERVISION OF A LICENSED DRIVER, PLEASE DRIVE IN AN EMPTY PARKING LOT FOR AT LEAST 2-3 TRIALS TO TEST REACTION TIME, VISION, USE OF EQUIPMENT IN CAR, ETC.  IF SUCCESSFUL WITH THE PARKING LOT DRIVING, PROCEED TO SUPERVISED DRIVING TRIALS IN YOUR NEIGHBORHOOD STREETS AT LOW TRAFFIC TIMES TO TEST OBSERVATION TO TRAFFIC SIGNALS, REACTION TIME, ETC. PLEASE ATTEMPT AT LEAST 2-3 TRIALS IN YOUR NEIGHBORHOOD.  IF NEIGHBORHOOD DRIVING IS SUCCESSFUL, YOU MAY PROCEED TO DRIVING IN BUSIER AREAS IN YOUR COMMUNITY WITH SUPERVISION OF A LICENSED DRIVER. PLEASE ATTEMPT AT LEAST 4-5 TRIALS.  IF COMMUNITY DRIVING IS SUCCESSFUL, YOU MAY PROCEED TO DRIVING ALONE, DURING THE DAY TIME, IN NON-PEAK TRAFFIC TIMES. YOU SHOULD DRIVE NO FURTHER THAN 15 MINUTES IN ONE DIRECTION. PLEASE DO NOT DRIVE IF YOU FEEL FATIGUED OR UNDER THE INFLUENCE OF MEDICATION.
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
## 2022-06-09 ENCOUNTER — Ambulatory Visit (HOSPITAL_COMMUNITY)
Admission: EM | Admit: 2022-06-09 | Discharge: 2022-06-09 | Disposition: A | Discharge status: Home / Self Care | Payer: Self-pay | Attending: Emergency Medicine | Admitting: Emergency Medicine

## 2022-06-09 ENCOUNTER — Encounter (HOSPITAL_COMMUNITY): Payer: Self-pay | Admitting: Emergency Medicine

## 2022-06-09 DIAGNOSIS — R369 Urethral discharge, unspecified: Secondary | ICD-10-CM | POA: Insufficient documentation

## 2022-06-09 MED ORDER — CEFTRIAXONE SODIUM 500 MG IJ SOLR
INTRAMUSCULAR | Status: AC
  Filled 2022-06-09: qty 500

## 2022-06-09 MED ORDER — DOXYCYCLINE HYCLATE 100 MG PO CAPS: 100.0000 mg | ORAL_CAPSULE | Freq: Two times a day (BID) | ORAL | 0 refills | Status: DC

## 2022-06-09 MED ORDER — LIDOCAINE HCL (PF) 1 % IJ SOLN
INTRAMUSCULAR | Status: AC
  Filled 2022-06-09: qty 2

## 2022-06-09 MED ORDER — CEFTRIAXONE SODIUM 500 MG IJ SOLR
500.0000 mg | INTRAMUSCULAR | Status: DC
  Administered 2022-06-09: 500 mg via INTRAMUSCULAR

## 2022-06-09 NOTE — Discharge Instructions (Addendum)
Today you are being treated prophylactically for gonorrhea: Chlamydia, based on your symptoms you most likely have at least 1 of these infections  You were given an injection of Rocephin here in the office for treatment of gonorrhea  Begin doxycycline every morning and every evening for 7 days for treatment of chlamydia  Labs pending 2-3 days, you will be contacted if positive for any sti and treatment will be sent to the pharmacy, you will have to return to the clinic if positive for gonorrhea to receive treatment   Please refrain from having sex until labs results, if positive please refrain from having sex until treatment complete and symptoms resolve   If positive for HIV, Syphilis, Chlamydia  gonorrhea or trichomoniasis please notify partner or partners so they may tested as well, please ensure that all partners have completed testing and treatment before resuming sexual activity or you will cause reinfection  Moving forward, it is recommended you use some form of protection against the transmission of sti infections  such as condoms or dental dams with each sexual encounter
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
## 2022-06-09 NOTE — ED Triage Notes (Signed)
Pt reports had penile discharge for about 2 weeks. Requesting STD testing.

## 2022-06-10 LAB — RPR: RPR Ser Ql: NONREACTIVE

## 2022-06-10 LAB — CYTOLOGY, (ORAL, ANAL, URETHRAL) ANCILLARY ONLY
Chlamydia: NEGATIVE
Comment: NEGATIVE
Comment: NEGATIVE
Comment: NORMAL
Neisseria Gonorrhea: NEGATIVE
Trichomonas: NEGATIVE

## 2022-06-10 LAB — HIV ANTIBODY (ROUTINE TESTING W REFLEX): HIV Screen 4th Generation wRfx: NONREACTIVE

## 2022-06-23 ENCOUNTER — Ambulatory Visit (HOSPITAL_COMMUNITY)
Admission: EM | Admit: 2022-06-23 | Discharge: 2022-06-23 | Disposition: A | Discharge status: Home / Self Care | Payer: Self-pay | Attending: Family Medicine | Admitting: Family Medicine

## 2022-06-23 ENCOUNTER — Ambulatory Visit (INDEPENDENT_AMBULATORY_CARE_PROVIDER_SITE_OTHER): Payer: Self-pay

## 2022-06-23 ENCOUNTER — Encounter (HOSPITAL_COMMUNITY): Payer: Self-pay

## 2022-06-23 DIAGNOSIS — R079 Chest pain, unspecified: Secondary | ICD-10-CM

## 2022-06-23 DIAGNOSIS — R0789 Other chest pain: Secondary | ICD-10-CM

## 2022-06-23 MED ORDER — NAPROXEN 500 MG PO TABS: 500.0000 mg | ORAL_TABLET | Freq: Two times a day (BID) | ORAL | 0 refills | Status: DC | PRN
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
## 2023-04-13 IMAGING — CT CT HEAD W/O CM
4 of 5 series · 17 of 47 positions shown, 18 images · non-contrast
Comparison: August 23, 2020.

CLINICAL DATA: Altered mental status.

EXAM:
CT HEAD WITHOUT CONTRAST
TECHNIQUE: Contiguous axial images were obtained from the base of the skull
through the vertex without intravenous contrast.

[Series 2: head wo · axial · 0.37mm/px · z∈[-71,+13]mm · 3 of 37 slices shown, 4 images]
[im 10/37  brain]
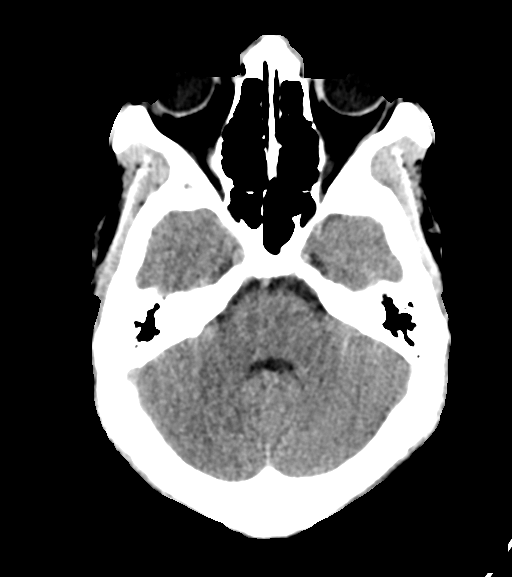
[im 10/37  bone]
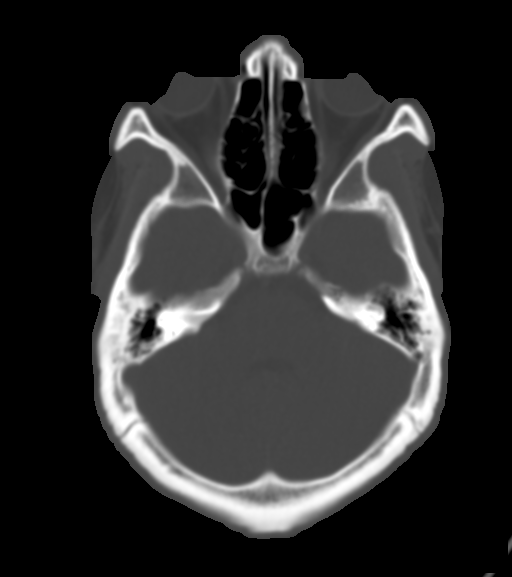
[im 19/37  brain]
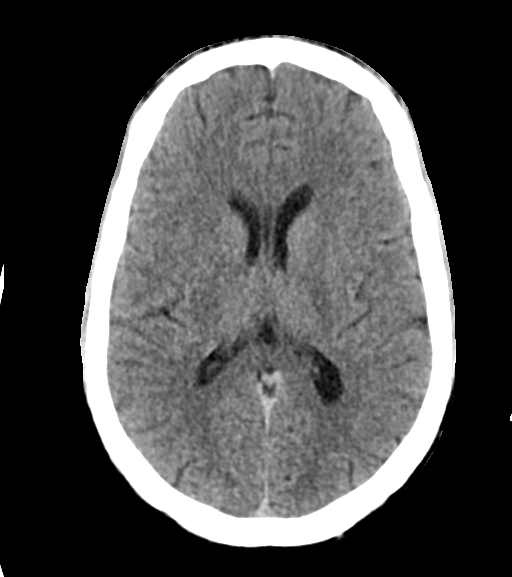
[im 28/37  brain]
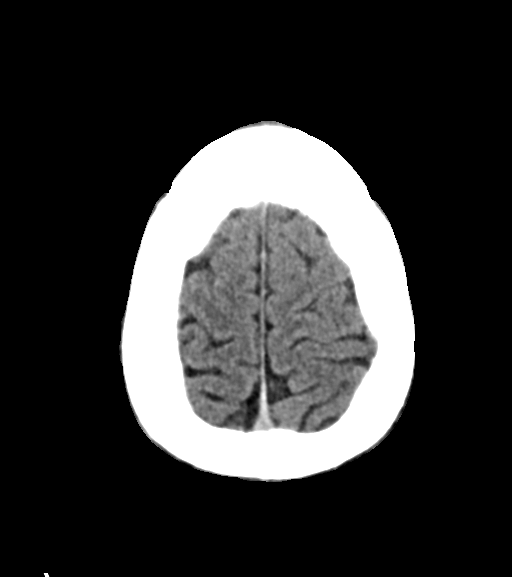

[Series 4: head bone · axial · 0.37mm/px · z∈[-100,+41]mm · 8 of 91 slices shown]
[im 8/91  bone]
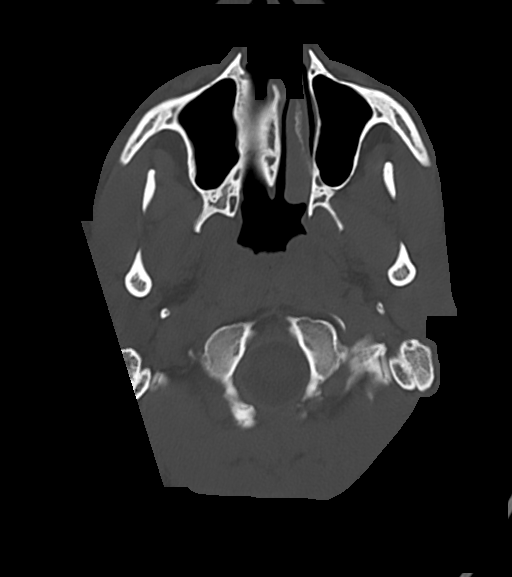
[im 23/91  bone]
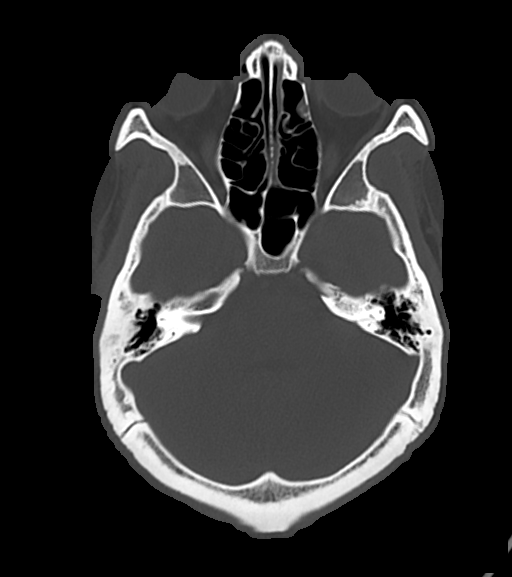
[im 31/91  bone]
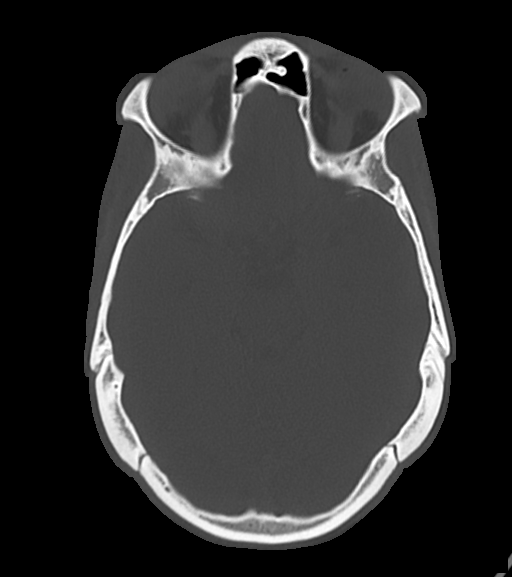
[im 38/91  bone]
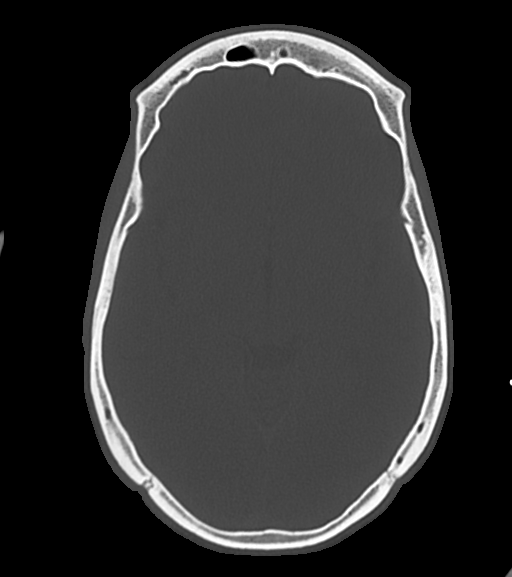
[im 53/91  bone]
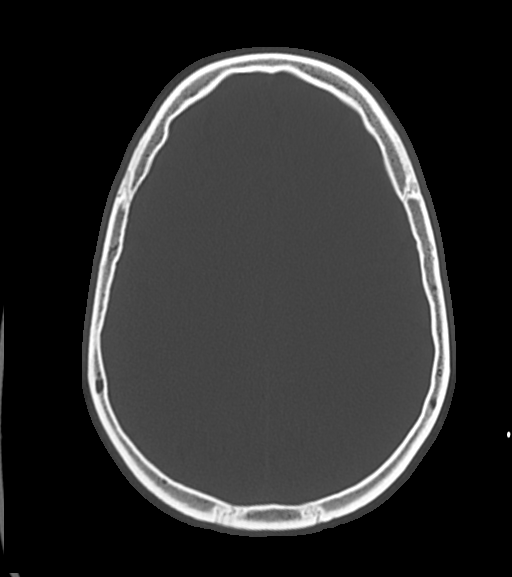
[im 61/91  bone]
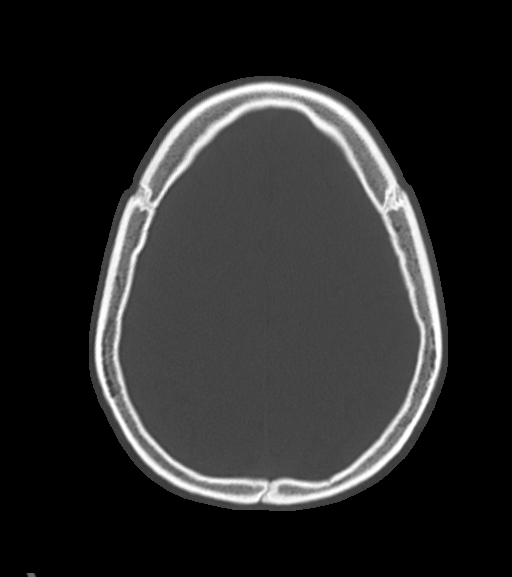
[im 68/91  bone]
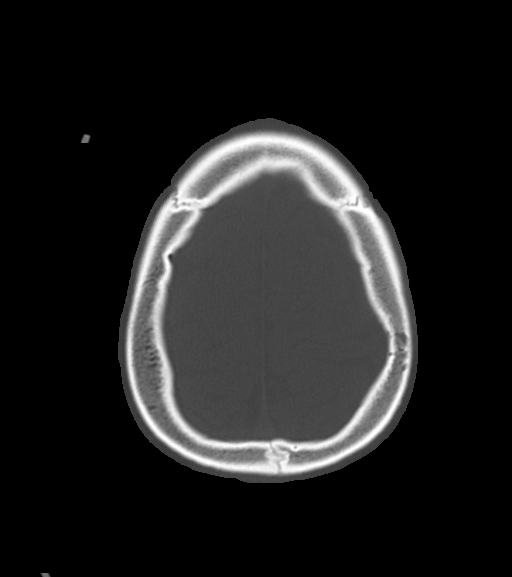
[im 83/91  bone]
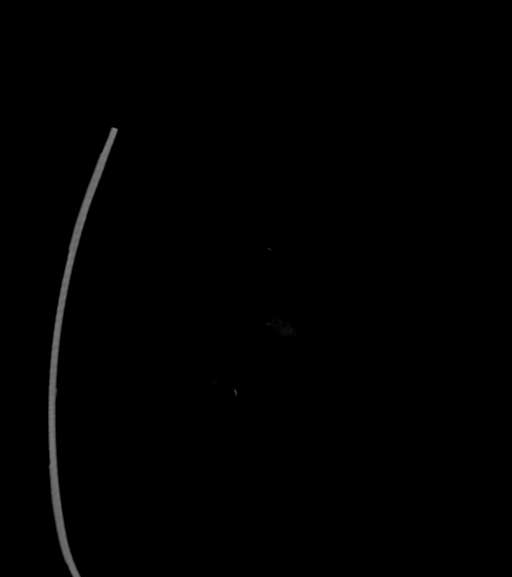

[Series 5: cor soft · coronal · 0.36mm/px · 3 of 72 slices shown]
[im 24/72  brain]
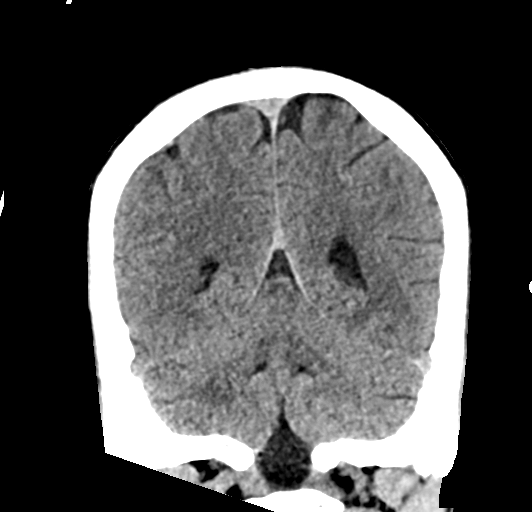
[im 32/72  brain]
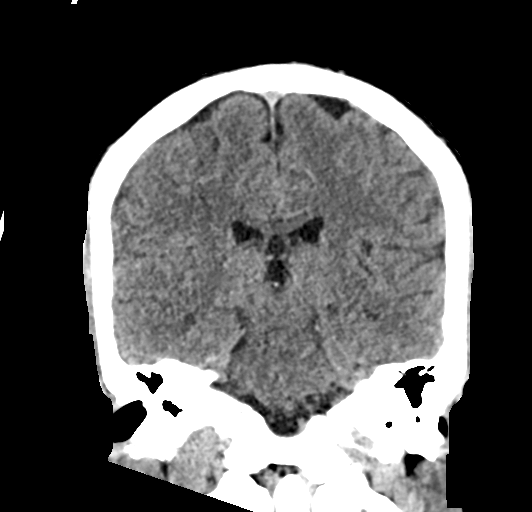
[im 40/72  brain]
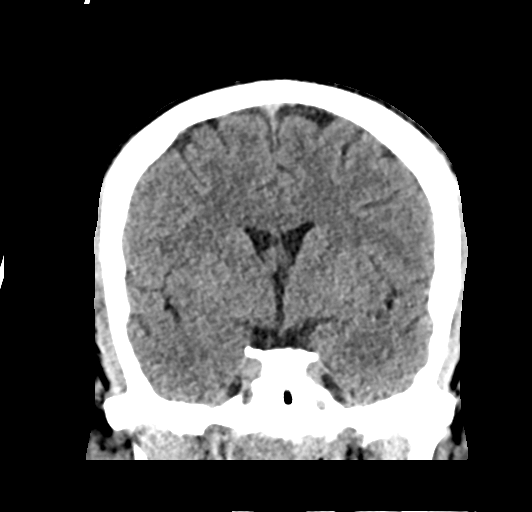

[Series 6: sag soft · sagittal · 0.36mm/px · 3 of 63 slices shown]
[im 28/63  brain]
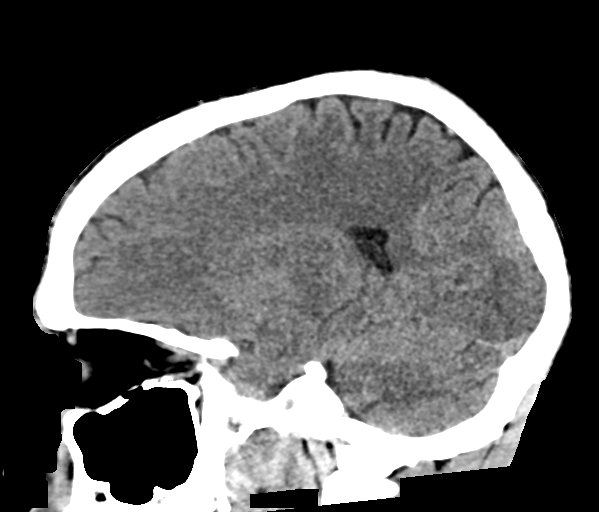
[im 32/63  brain]
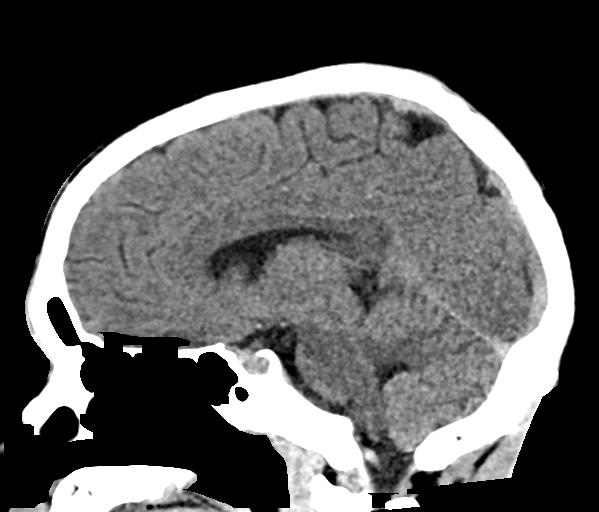
[im 35/63  brain]
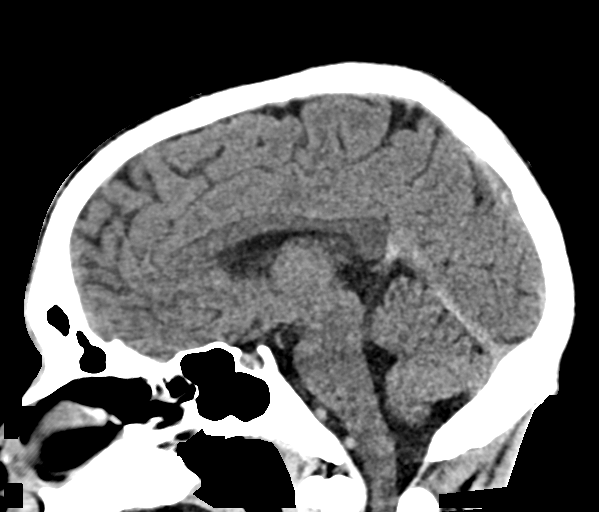

[17 of 47 positions shown; findings below may reference images not displayed]

FINDINGS: Brain: No evidence of acute infarction, hemorrhage, hydrocephalus,
extra-axial collection or mass lesion/mass effect.

Vascular: No hyperdense vessel or unexpected calcification.

Skull: Normal. Negative for fracture or focal lesion.

Sinuses/Orbits: No acute finding.

Other: None.
IMPRESSION: No acute intracranial abnormality seen.

## 2023-08-17 ENCOUNTER — Other Ambulatory Visit: Payer: Self-pay

## 2023-08-17 ENCOUNTER — Emergency Department (HOSPITAL_COMMUNITY)
Admission: EM | Admit: 2023-08-17 | Discharge: 2023-08-17 | Disposition: A | Discharge status: Home / Self Care | Payer: Self-pay | Attending: Emergency Medicine | Admitting: Emergency Medicine

## 2023-08-17 ENCOUNTER — Encounter (HOSPITAL_COMMUNITY): Payer: Self-pay | Admitting: Emergency Medicine

## 2023-08-17 DIAGNOSIS — Z0279 Encounter for issue of other medical certificate: Secondary | ICD-10-CM | POA: Insufficient documentation

## 2023-08-17 DIAGNOSIS — G40909 Epilepsy, unspecified, not intractable, without status epilepticus: Secondary | ICD-10-CM

## 2023-08-17 HISTORY — DX: Unspecified convulsions: R56.9

## 2023-08-17 NOTE — ED Provider Notes (Signed)
 [facility] [facility] AT [facility] Provider Note   CSN: [id number] Arrival date & time: 08/17/23  1356     History  Chief Complaint  Patient presents with   Letter for School/Work    Steve Mejia is a 23 y.o. male.  23 year old male presents requesting return to work note.  Patient seen at outside facility several days ago for having a seizure.  He has no seizure disorder and was supposed to be on Keppra and has been referred to neurology for clearance to go back to work.  Patient does operate a sewing machine at work.  He has no other complaints at this time.  Notes that he has been compliant with his seizure medication       Home Medications Prior to Admission medications   Medication Sig Start Date End Date Taking? Authorizing Provider  acetaminophen (TYLENOL) 160 MG/5ML suspension Take 20.3 mLs (650 mg total) by mouth every 6 (six) hours. 10/01/20   Love, Evlyn Kanner, PA-C  benzocaine (ORAJEL) 10 % mucosal gel Use as directed in the mouth or throat 2 (two) times daily as needed for mouth pain. 10/01/20   Love, Evlyn Kanner, PA-C  naproxen (NAPROSYN) 500 MG tablet Take 1 tablet (500 mg total) by mouth 2 (two) times daily as needed for mild pain or moderate pain. 06/23/22   Mardella Layman, MD      Allergies    Bee venom and Beeswax    Review of Systems   Review of Systems  All other systems reviewed and are negative.   Physical Exam Updated Vital Signs BP 127/83 (BP Location: Left Arm)   Pulse 75   Temp 98 F (36.7 C) (Oral)   Resp 16   SpO2 100%  Physical Exam Vitals and nursing note reviewed.  Constitutional:      General: He is not in acute distress.    Appearance: Normal appearance. He is well-developed. He is not toxic-appearing.  HENT:     Head: Normocephalic and atraumatic.  Eyes:     General: Lids are normal.     Conjunctiva/sclera: Conjunctivae normal.     Pupils: Pupils are equal, round, and reactive to light.  Neck:     Thyroid: No  thyroid mass.     Trachea: No tracheal deviation.  Cardiovascular:     Rate and Rhythm: Normal rate and regular rhythm.     Heart sounds: Normal heart sounds. No murmur heard.    No gallop.  Pulmonary:     Effort: Pulmonary effort is normal. No respiratory distress.     Breath sounds: Normal breath sounds. No stridor. No decreased breath sounds, wheezing, rhonchi or rales.  Abdominal:     General: There is no distension.     Palpations: Abdomen is soft.     Tenderness: There is no abdominal tenderness. There is no rebound.  Musculoskeletal:        General: No tenderness. Normal range of motion.     Cervical back: Normal range of motion and neck supple.  Skin:    General: Skin is warm and dry.     Findings: No abrasion or rash.  Neurological:     Mental Status: He is alert and oriented to person, place, and time. Mental status is at baseline.     GCS: GCS eye subscore is 4. GCS verbal subscore is 5. GCS motor subscore is 6.     Cranial Nerves: No cranial nerve deficit.     Sensory:  No sensory deficit.     Motor: Motor function is intact.  Psychiatric:        Attention and Perception: Attention normal.        Speech: Speech normal.        Behavior: Behavior normal.     ED Results / Procedures / Treatments   Labs (all labs ordered are listed, but only abnormal results are displayed) Labs Reviewed - No data to display  EKG None  Radiology No results found.  Procedures Procedures    Medications Ordered in ED Medications - No data to display  ED Course/ Medical Decision Making/ A&P                                 Medical Decision Making  Will be given referral to neurologist here in town as he had trouble following up with the 1 that is in [facility].        Final Clinical Impression(s) / ED Diagnoses Final diagnoses:  None    Rx / DC Orders ED Discharge Orders     None         Lorre Nick, MD 08/17/23 1429

## 2023-08-17 NOTE — Discharge Instructions (Addendum)
 Call the neurologist to schedule an appointment to be seen for clearance to go back to work

## 2023-08-17 NOTE — ED Triage Notes (Addendum)
 Patient was at work on Friday when he had a seizure. He woke in the hospital, but was cleared to go. His job needs a note to show that he is cleared to go back to work.
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
## 2023-08-24 ENCOUNTER — Ambulatory Visit (INDEPENDENT_AMBULATORY_CARE_PROVIDER_SITE_OTHER): Payer: Self-pay | Admitting: Neurology

## 2023-08-24 ENCOUNTER — Encounter: Payer: Self-pay | Admitting: Neurology

## 2023-08-24 VITALS — BP 121/76 | HR 46 | Ht 75.0 in | Wt 152.0 lb

## 2023-08-24 DIAGNOSIS — S0990XD Unspecified injury of head, subsequent encounter: Secondary | ICD-10-CM

## 2023-08-24 DIAGNOSIS — R569 Unspecified convulsions: Secondary | ICD-10-CM | POA: Insufficient documentation

## 2023-08-24 DIAGNOSIS — S069X3S Unspecified intracranial injury with loss of consciousness of 1 hour to 5 hours 59 minutes, sequela: Secondary | ICD-10-CM

## 2023-08-24 MED ORDER — LEVETIRACETAM 500 MG PO TABS: ORAL_TABLET | ORAL | 11 refills | Status: AC
